# Patient Record
Sex: Female | Born: 1935 | Race: White | Hispanic: No | Marital: Married | Smoking: Never smoker
Health system: Southern US, Community
[De-identification: ages and names within clinical notes are randomized; demographics above are authoritative.]

## PROBLEM LIST (undated history)

## (undated) DIAGNOSIS — I509 Heart failure, unspecified: Secondary | ICD-10-CM

## (undated) DIAGNOSIS — G629 Polyneuropathy, unspecified: Secondary | ICD-10-CM

## (undated) DIAGNOSIS — I2510 Atherosclerotic heart disease of native coronary artery without angina pectoris: Secondary | ICD-10-CM

## (undated) DIAGNOSIS — H8109 Meniere's disease, unspecified ear: Secondary | ICD-10-CM

## (undated) HISTORY — PX: OTHER SURGICAL HISTORY: SHX169

## (undated) HISTORY — PX: LAPAROSCOPIC GASTRIC BANDING: SHX1100

---

## 2018-01-15 ENCOUNTER — Inpatient Hospital Stay
Admission: EM | Admit: 2018-01-15 | Discharge: 2018-01-19 | DRG: 481 | Disposition: A | Discharge status: Skilled Nursing Facility | Payer: Medicare PPO | Attending: Internal Medicine | Admitting: Internal Medicine

## 2018-01-15 ENCOUNTER — Encounter: Payer: Self-pay | Admitting: *Deleted

## 2018-01-15 ENCOUNTER — Other Ambulatory Visit: Payer: Self-pay

## 2018-01-15 ENCOUNTER — Emergency Department: Payer: Medicare PPO

## 2018-01-15 DIAGNOSIS — Z79899 Other long term (current) drug therapy: Secondary | ICD-10-CM

## 2018-01-15 DIAGNOSIS — Z66 Do not resuscitate: Secondary | ICD-10-CM | POA: Diagnosis present

## 2018-01-15 DIAGNOSIS — D62 Acute posthemorrhagic anemia: Secondary | ICD-10-CM | POA: Diagnosis not present

## 2018-01-15 DIAGNOSIS — W1830XA Fall on same level, unspecified, initial encounter: Secondary | ICD-10-CM | POA: Diagnosis present

## 2018-01-15 DIAGNOSIS — N189 Chronic kidney disease, unspecified: Secondary | ICD-10-CM | POA: Diagnosis present

## 2018-01-15 DIAGNOSIS — Z419 Encounter for procedure for purposes other than remedying health state, unspecified: Secondary | ICD-10-CM

## 2018-01-15 DIAGNOSIS — G629 Polyneuropathy, unspecified: Secondary | ICD-10-CM | POA: Diagnosis present

## 2018-01-15 DIAGNOSIS — I10 Essential (primary) hypertension: Secondary | ICD-10-CM | POA: Diagnosis present

## 2018-01-15 DIAGNOSIS — I2510 Atherosclerotic heart disease of native coronary artery without angina pectoris: Secondary | ICD-10-CM | POA: Diagnosis present

## 2018-01-15 DIAGNOSIS — Z6839 Body mass index (BMI) 39.0-39.9, adult: Secondary | ICD-10-CM

## 2018-01-15 DIAGNOSIS — W19XXXA Unspecified fall, initial encounter: Secondary | ICD-10-CM

## 2018-01-15 DIAGNOSIS — E669 Obesity, unspecified: Secondary | ICD-10-CM | POA: Diagnosis present

## 2018-01-15 DIAGNOSIS — S72142A Displaced intertrochanteric fracture of left femur, initial encounter for closed fracture: Principal | ICD-10-CM | POA: Diagnosis present

## 2018-01-15 DIAGNOSIS — I509 Heart failure, unspecified: Secondary | ICD-10-CM | POA: Diagnosis present

## 2018-01-15 DIAGNOSIS — Z955 Presence of coronary angioplasty implant and graft: Secondary | ICD-10-CM | POA: Diagnosis not present

## 2018-01-15 DIAGNOSIS — Z7982 Long term (current) use of aspirin: Secondary | ICD-10-CM

## 2018-01-15 DIAGNOSIS — Z791 Long term (current) use of non-steroidal anti-inflammatories (NSAID): Secondary | ICD-10-CM | POA: Diagnosis not present

## 2018-01-15 DIAGNOSIS — D631 Anemia in chronic kidney disease: Secondary | ICD-10-CM | POA: Diagnosis present

## 2018-01-15 DIAGNOSIS — H8109 Meniere's disease, unspecified ear: Secondary | ICD-10-CM | POA: Diagnosis present

## 2018-01-15 DIAGNOSIS — S72009A Fracture of unspecified part of neck of unspecified femur, initial encounter for closed fracture: Secondary | ICD-10-CM | POA: Diagnosis present

## 2018-01-15 DIAGNOSIS — I130 Hypertensive heart and chronic kidney disease with heart failure and stage 1 through stage 4 chronic kidney disease, or unspecified chronic kidney disease: Secondary | ICD-10-CM | POA: Diagnosis present

## 2018-01-15 HISTORY — DX: Atherosclerotic heart disease of native coronary artery without angina pectoris: I25.10

## 2018-01-15 HISTORY — DX: Meniere's disease, unspecified ear: H81.09

## 2018-01-15 HISTORY — DX: Heart failure, unspecified: I50.9

## 2018-01-15 HISTORY — DX: Polyneuropathy, unspecified: G62.9

## 2018-01-15 LAB — CBC WITH DIFFERENTIAL/PLATELET
Abs Immature Granulocytes: 0.05 10*3/uL (ref 0.00–0.07)
Basophils Absolute: 0.1 10*3/uL (ref 0.0–0.1)
Basophils Relative: 1 %
Eosinophils Absolute: 0.2 10*3/uL (ref 0.0–0.5)
Eosinophils Relative: 2 %
HCT: 34.6 % — ABNORMAL LOW (ref 36.0–46.0)
Hemoglobin: 10.9 g/dL — ABNORMAL LOW (ref 12.0–15.0)
Immature Granulocytes: 1 %
Lymphocytes Relative: 39 %
Lymphs Abs: 3.9 10*3/uL (ref 0.7–4.0)
MCH: 29.2 pg (ref 26.0–34.0)
MCHC: 31.5 g/dL (ref 30.0–36.0)
MCV: 92.8 fL (ref 80.0–100.0)
MONO ABS: 0.7 10*3/uL (ref 0.1–1.0)
Monocytes Relative: 7 %
Neutro Abs: 5.1 10*3/uL (ref 1.7–7.7)
Neutrophils Relative %: 50 %
Platelets: 232 10*3/uL (ref 150–400)
RBC: 3.73 MIL/uL — ABNORMAL LOW (ref 3.87–5.11)
RDW: 13.6 % (ref 11.5–15.5)
WBC: 10 10*3/uL (ref 4.0–10.5)
nRBC: 0 % (ref 0.0–0.2)

## 2018-01-15 LAB — COMPREHENSIVE METABOLIC PANEL
ALT: 19 U/L (ref 0–44)
AST: 28 U/L (ref 15–41)
Albumin: 4 g/dL (ref 3.5–5.0)
Alkaline Phosphatase: 89 U/L (ref 38–126)
Anion gap: 11 (ref 5–15)
BUN: 20 mg/dL (ref 8–23)
CO2: 25 mmol/L (ref 22–32)
Calcium: 8.8 mg/dL — ABNORMAL LOW (ref 8.9–10.3)
Chloride: 102 mmol/L (ref 98–111)
Creatinine, Ser: 1.22 mg/dL — ABNORMAL HIGH (ref 0.44–1.00)
GFR calc Af Amer: 48 mL/min — ABNORMAL LOW (ref >60)
GFR, EST NON AFRICAN AMERICAN: 41 mL/min — AB (ref >60)
Glucose, Bld: 145 mg/dL — ABNORMAL HIGH (ref 70–99)
Potassium: 4.1 mmol/L (ref 3.5–5.1)
Sodium: 138 mmol/L (ref 135–145)
Total Bilirubin: 0.5 mg/dL (ref 0.3–1.2)
Total Protein: 6.5 g/dL (ref 6.5–8.1)

## 2018-01-15 NOTE — ED Notes (Signed)
Report off to henry rn  

## 2018-01-15 NOTE — ED Notes (Signed)
Pt reports falling backwards and hit head.  No loc. No lac.  Pt denies neck or back pain.  Pt has left hip pain with rotation and shortening.  Pt alert.  Speech clear.  Iv in place.

## 2018-01-15 NOTE — ED Provider Notes (Signed)
[facility] Emergency Department Provider Note    First MD Initiated Contact with Patient 01/15/18 2154     (approximate)  I have reviewed the triage vital signs and the nursing notes.   HISTORY  Chief Complaint Fall    HPI Blanchard KelchRuthann Raider is a 85 y.o. female history of sensitive neuropathy not any blood thinners presents the ER after mechanical fall.  States she was getting up without assistance to get clean dishes and fell hitting her left hip.  Did not hit her head.  No LOC.  Denies any chest pain or shortness of breath prior to the episode.  Was unable to stand due to pain.  Patient arrives with EMS with obvious deformity to left leg with shortened and rotated leg.    No past medical history on file. No family history on file.  There are no active problems to display for this patient.     Prior to Admission medications   Not on File    Allergies Patient has no known allergies.    Social History Social History   Tobacco Use  . Smoking status: Never Smoker  . Smokeless tobacco: Never Used  Substance Use Topics  . Alcohol use: Never    Frequency: Never  . Drug use: Never    Review of Systems Patient denies headaches, rhinorrhea, blurry vision, numbness, shortness of breath, chest pain, edema, cough, abdominal pain, nausea, vomiting, diarrhea, dysuria, fevers, rashes or hallucinations unless otherwise stated above in HPI. ____________________________________________   PHYSICAL EXAM:  VITAL SIGNS: Vitals:   01/15/18 2105 01/15/18 2204  BP: (!) 151/76 (!) 150/74  Pulse: 90 84  Resp: 20 15  Temp: 98.4 F (36.9 C)   SpO2: 93% 97%    Constitutional: Alert and oriented.  Eyes: Conjunctivae are normal.  Head: Atraumatic. Nose: No congestion/rhinnorhea. Mouth/Throat: Mucous membranes are moist.   Neck: No stridor. Painless ROM.  Cardiovascular: Normal rate, regular rhythm. Grossly normal heart sounds.  Good peripheral  circulation. Respiratory: Normal respiratory effort.  No retractions. Lungs CTAB. Gastrointestinal: Soft and nontender. No distention. No abdominal bruits. No CVA tenderness. Genitourinary:  Musculoskeletal: pain with left leg log roll, shortened and externally rotated. No lower extremity tenderness nor edema.  No joint effusions. Neurologic:  Normal speech and language. No gross focal neurologic deficits are appreciated. No facial droop Skin:  Skin is warm, dry and intact. No rash noted. Psychiatric: Mood and affect are normal. Speech and behavior are normal.  ____________________________________________   LABS (all labs ordered are listed, but only abnormal results are displayed)  Results for orders placed or performed during the hospital encounter of 01/15/18 (from the past 24 hour(s))  CBC with Differential/Platelet     Status: Abnormal   Collection Time: 01/15/18  9:15 PM  Result Value Ref Range   WBC 10.0 4.0 - 10.5 K/uL   RBC 3.73 (L) 3.87 - 5.11 MIL/uL   Hemoglobin 10.9 (L) 12.0 - 15.0 g/dL   HCT 40.934.6 (L) 81.136.0 - 91.446.0 %   MCV 92.8 80.0 - 100.0 fL   MCH 29.2 26.0 - 34.0 pg   MCHC 31.5 30.0 - 36.0 g/dL   RDW 78.213.6 95.611.5 - 21.315.5 %   Platelets 232 150 - 400 K/uL   nRBC 0.0 0.0 - 0.2 %   Neutrophils Relative % 50 %   Neutro Abs 5.1 1.7 - 7.7 K/uL   Lymphocytes Relative 39 %   Lymphs Abs 3.9 0.7 - 4.0 K/uL   Monocytes Relative  7 %   Monocytes Absolute 0.7 0.1 - 1.0 K/uL   Eosinophils Relative 2 %   Eosinophils Absolute 0.2 0.0 - 0.5 K/uL   Basophils Relative 1 %   Basophils Absolute 0.1 0.0 - 0.1 K/uL   Immature Granulocytes 1 %   Abs Immature Granulocytes 0.05 0.00 - 0.07 K/uL  Comprehensive metabolic panel     Status: Abnormal   Collection Time: 01/15/18  9:15 PM  Result Value Ref Range   Sodium 138 135 - 145 mmol/L   Potassium 4.1 3.5 - 5.1 mmol/L   Chloride 102 98 - 111 mmol/L   CO2 25 22 - 32 mmol/L   Glucose, Bld 145 (H) 70 - 99 mg/dL   BUN 20 8 - 23 mg/dL    Creatinine, Ser 6.28 (H) 0.44 - 1.00 mg/dL   Calcium 8.8 (L) 8.9 - 10.3 mg/dL   Total Protein 6.5 6.5 - 8.1 g/dL   Albumin 4.0 3.5 - 5.0 g/dL   AST 28 15 - 41 U/L   ALT 19 0 - 44 U/L   Alkaline Phosphatase 89 38 - 126 U/L   Total Bilirubin 0.5 0.3 - 1.2 mg/dL   GFR calc non Af Amer 41 (L) >60 mL/min   GFR calc Af Amer 48 (L) >60 mL/min   Anion gap 11 5 - 15   ____________________________________________  EKG My review and personal interpretation at Time: 21:10   Indication: fall  Rate: 90  Rhythm: sinus Axis: normal Other: normal intervals, no stemi ____________________________________________  RADIOLOGY  I personally reviewed all radiographic images ordered to evaluate for the above acute complaints and reviewed radiology reports and findings.  These findings were personally discussed with the patient.  Please see medical record for radiology report.  ____________________________________________   PROCEDURES  Procedure(s) performed:  Procedures    Critical Care performed: no ____________________________________________   INITIAL IMPRESSION / ASSESSMENT AND PLAN / ED COURSE  Pertinent labs & imaging results that were available during my care of the patient were reviewed by me and considered in my medical decision making (see chart for details).   DDX: fracture, contusion, dislocation  Shalin Beadnell is a 85 y.o. who presents to the ED with evidence of left hip fracture.  Blood work is reassuring.  [facility]  Have discussed with the patient and available family all diagnostics and treatments performed thus far and all questions were answered to the best of my ability. The patient demonstrates understanding and agreement with plan.       As part of my medical decision making, I reviewed the following data within the electronic MEDICAL RECORD NUMBER Nursing notes reviewed and incorporated, Labs reviewed, notes from  prior ED visits.   ____________________________________________   FINAL CLINICAL IMPRESSION(S) / ED DIAGNOSES  Final diagnoses:  Closed 2-part intertrochanteric fracture of left femur, initial encounter (HCC)      NEW MEDICATIONS STARTED DURING THIS VISIT:  New Prescriptions   No medications on file     Note:  This document was prepared using Dragon voice recognition software and may include unintentional dictation errors.    Willy Eddy, MD 01/15/18 2256

## 2018-01-15 NOTE — ED Triage Notes (Signed)
Pt brought in via  [facility] from [facility].  Pt fell and has shortening of leg and rotation.  Pt has left hip pain.  Pt alert.  Iv in place

## 2018-01-16 ENCOUNTER — Inpatient Hospital Stay: Payer: Medicare PPO

## 2018-01-16 ENCOUNTER — Other Ambulatory Visit: Payer: Self-pay

## 2018-01-16 ENCOUNTER — Inpatient Hospital Stay: Payer: Medicare PPO | Admitting: Anesthesiology

## 2018-01-16 ENCOUNTER — Encounter: Payer: Self-pay | Admitting: Internal Medicine

## 2018-01-16 ENCOUNTER — Encounter
Admission: EM | Disposition: A | Discharge status: Skilled Nursing Facility | Payer: Self-pay | Source: Home / Self Care | Attending: Internal Medicine

## 2018-01-16 HISTORY — PX: INTRAMEDULLARY (IM) NAIL INTERTROCHANTERIC: SHX5875

## 2018-01-16 LAB — LIPID PANEL
Cholesterol: 196 mg/dL (ref 0–200)
HDL: 39 mg/dL — ABNORMAL LOW (ref >40)
LDL CALC: 102 mg/dL — AB (ref 0–99)
Total CHOL/HDL Ratio: 5 RATIO
Triglycerides: 275 mg/dL — ABNORMAL HIGH (ref <150)
VLDL: 55 mg/dL — ABNORMAL HIGH (ref 0–40)

## 2018-01-16 LAB — CBC
HCT: 32.8 % — ABNORMAL LOW (ref 36.0–46.0)
Hemoglobin: 10.3 g/dL — ABNORMAL LOW (ref 12.0–15.0)
MCH: 28.9 pg (ref 26.0–34.0)
MCHC: 31.4 g/dL (ref 30.0–36.0)
MCV: 91.9 fL (ref 80.0–100.0)
Platelets: 201 10*3/uL (ref 150–400)
RBC: 3.57 MIL/uL — ABNORMAL LOW (ref 3.87–5.11)
RDW: 13.4 % (ref 11.5–15.5)
WBC: 10.9 10*3/uL — ABNORMAL HIGH (ref 4.0–10.5)
nRBC: 0 % (ref 0.0–0.2)

## 2018-01-16 LAB — PROTIME-INR
INR: 0.96
Prothrombin Time: 12.7 seconds (ref 11.4–15.2)

## 2018-01-16 LAB — BASIC METABOLIC PANEL
Anion gap: 9 (ref 5–15)
BUN: 20 mg/dL (ref 8–23)
CO2: 26 mmol/L (ref 22–32)
CREATININE: 1.19 mg/dL — AB (ref 0.44–1.00)
Calcium: 8.6 mg/dL — ABNORMAL LOW (ref 8.9–10.3)
Chloride: 102 mmol/L (ref 98–111)
GFR calc non Af Amer: 42 mL/min — ABNORMAL LOW (ref >60)
GFR, EST AFRICAN AMERICAN: 49 mL/min — AB (ref >60)
Glucose, Bld: 156 mg/dL — ABNORMAL HIGH (ref 70–99)
Potassium: 4.1 mmol/L (ref 3.5–5.1)
Sodium: 137 mmol/L (ref 135–145)

## 2018-01-16 LAB — SURGICAL PCR SCREEN
MRSA, PCR: NEGATIVE
Staphylococcus aureus: NEGATIVE

## 2018-01-16 LAB — SAMPLE TO BLOOD BANK

## 2018-01-16 SURGERY — FIXATION, FRACTURE, INTERTROCHANTERIC, WITH INTRAMEDULLARY ROD
Anesthesia: Spinal | Site: Hip | Laterality: Left

## 2018-01-16 MED ORDER — CEFAZOLIN SODIUM-DEXTROSE 2-4 GM/100ML-% IV SOLN
2.0000 g | INTRAVENOUS | Status: AC
  Administered 2018-01-16: 2 g via INTRAVENOUS
  Filled 2018-01-16: qty 100

## 2018-01-16 MED ORDER — SODIUM CHLORIDE 0.9 % IV SOLN
INTRAVENOUS | Status: DC | PRN
  Administered 2018-01-16: 1000 mL

## 2018-01-16 MED ORDER — PHENYLEPHRINE HCL 10 MG/ML IJ SOLN
INTRAMUSCULAR | Status: DC | PRN
  Administered 2018-01-16 (×2): 100 ug via INTRAVENOUS

## 2018-01-16 MED ORDER — PROPOFOL 500 MG/50ML IV EMUL
INTRAVENOUS | Status: DC | PRN
  Administered 2018-01-16: 25 ug/kg/min via INTRAVENOUS

## 2018-01-16 MED ORDER — KETAMINE HCL 50 MG/ML IJ SOLN
INTRAMUSCULAR | Status: AC
  Filled 2018-01-16: qty 10

## 2018-01-16 MED ORDER — BISACODYL 10 MG RE SUPP: 10.0000 mg | Freq: Every day | RECTAL | Status: DC | PRN

## 2018-01-16 MED ORDER — PANTOPRAZOLE SODIUM 40 MG PO TBEC
40.0000 mg | DELAYED_RELEASE_TABLET | Freq: Two times a day (BID) | ORAL | Status: DC
  Administered 2018-01-16 – 2018-01-19 (×6): 40 mg via ORAL
  Filled 2018-01-16 (×6): qty 1

## 2018-01-16 MED ORDER — MENTHOL 3 MG MT LOZG
1.0000 | LOZENGE | OROMUCOSAL | Status: DC | PRN
  Filled 2018-01-16: qty 9

## 2018-01-16 MED ORDER — ATENOLOL 25 MG PO TABS
25.0000 mg | ORAL_TABLET | Freq: Every day | ORAL | Status: DC
  Administered 2018-01-16 – 2018-01-19 (×4): 25 mg via ORAL
  Filled 2018-01-16 (×4): qty 1

## 2018-01-16 MED ORDER — OXYCODONE HCL 5 MG PO TABS
5.0000 mg | ORAL_TABLET | ORAL | Status: DC | PRN
  Administered 2018-01-17 – 2018-01-19 (×3): 5 mg via ORAL
  Filled 2018-01-16 (×3): qty 1

## 2018-01-16 MED ORDER — ESCITALOPRAM OXALATE 10 MG PO TABS
20.0000 mg | ORAL_TABLET | Freq: Every day | ORAL | Status: DC
  Administered 2018-01-17 – 2018-01-19 (×3): 20 mg via ORAL
  Filled 2018-01-16 (×4): qty 2

## 2018-01-16 MED ORDER — CELECOXIB 200 MG PO CAPS
200.0000 mg | ORAL_CAPSULE | Freq: Two times a day (BID) | ORAL | Status: DC
  Administered 2018-01-16 – 2018-01-19 (×6): 200 mg via ORAL
  Filled 2018-01-16 (×6): qty 1

## 2018-01-16 MED ORDER — DOCUSATE SODIUM 100 MG PO CAPS: 100.0000 mg | ORAL_CAPSULE | Freq: Two times a day (BID) | ORAL | Status: DC | PRN

## 2018-01-16 MED ORDER — OXYCODONE HCL 5 MG PO TABS
5.0000 mg | ORAL_TABLET | ORAL | Status: DC | PRN
  Administered 2018-01-16: 5 mg via ORAL
  Administered 2018-01-16: 10 mg via ORAL
  Filled 2018-01-16: qty 2
  Filled 2018-01-16 (×2): qty 1

## 2018-01-16 MED ORDER — CEFAZOLIN SODIUM-DEXTROSE 2-4 GM/100ML-% IV SOLN
2.0000 g | Freq: Four times a day (QID) | INTRAVENOUS | Status: AC
  Administered 2018-01-16 – 2018-01-17 (×4): 2 g via INTRAVENOUS
  Filled 2018-01-16 (×5): qty 100

## 2018-01-16 MED ORDER — GABAPENTIN 300 MG PO CAPS
300.0000 mg | ORAL_CAPSULE | Freq: Three times a day (TID) | ORAL | Status: DC
  Administered 2018-01-17 – 2018-01-19 (×5): 300 mg via ORAL
  Filled 2018-01-16 (×8): qty 1

## 2018-01-16 MED ORDER — ONDANSETRON HCL 4 MG/2ML IJ SOLN: 4.0000 mg | Freq: Four times a day (QID) | INTRAMUSCULAR | Status: DC | PRN

## 2018-01-16 MED ORDER — METOCLOPRAMIDE HCL 5 MG/ML IJ SOLN: 5.0000 mg | Freq: Three times a day (TID) | INTRAMUSCULAR | Status: DC | PRN

## 2018-01-16 MED ORDER — ACETAMINOPHEN 10 MG/ML IV SOLN
1000.0000 mg | Freq: Four times a day (QID) | INTRAVENOUS | Status: AC
  Administered 2018-01-17 (×4): 1000 mg via INTRAVENOUS
  Filled 2018-01-16 (×4): qty 100

## 2018-01-16 MED ORDER — FERROUS SULFATE 325 (65 FE) MG PO TABS
325.0000 mg | ORAL_TABLET | Freq: Two times a day (BID) | ORAL | Status: DC
  Administered 2018-01-17 – 2018-01-19 (×5): 325 mg via ORAL
  Filled 2018-01-16 (×5): qty 1

## 2018-01-16 MED ORDER — ACETAMINOPHEN 10 MG/ML IV SOLN
INTRAVENOUS | Status: AC
  Filled 2018-01-16: qty 100

## 2018-01-16 MED ORDER — PHENOL 1.4 % MT LIQD
1.0000 | OROMUCOSAL | Status: DC | PRN
  Filled 2018-01-16: qty 177

## 2018-01-16 MED ORDER — MECLIZINE HCL 25 MG PO TABS
25.0000 mg | ORAL_TABLET | Freq: Two times a day (BID) | ORAL | Status: DC
  Administered 2018-01-17 – 2018-01-19 (×5): 25 mg via ORAL
  Filled 2018-01-16 (×8): qty 1

## 2018-01-16 MED ORDER — NORTRIPTYLINE HCL 10 MG PO CAPS
40.0000 mg | ORAL_CAPSULE | Freq: Every day | ORAL | Status: DC
  Administered 2018-01-17 – 2018-01-18 (×2): 40 mg via ORAL
  Filled 2018-01-16 (×4): qty 4

## 2018-01-16 MED ORDER — SODIUM CHLORIDE 0.9 % IV SOLN
INTRAVENOUS | Status: DC
  Administered 2018-01-16: 23:00:00 via INTRAVENOUS

## 2018-01-16 MED ORDER — PANTOPRAZOLE SODIUM 40 MG PO TBEC
40.0000 mg | DELAYED_RELEASE_TABLET | Freq: Every day | ORAL | Status: DC
  Administered 2018-01-16: 40 mg via ORAL
  Filled 2018-01-16: qty 1

## 2018-01-16 MED ORDER — GABAPENTIN 300 MG PO CAPS
300.0000 mg | ORAL_CAPSULE | Freq: Every day | ORAL | Status: DC
  Administered 2018-01-16 – 2018-01-18 (×3): 300 mg via ORAL
  Filled 2018-01-16: qty 1

## 2018-01-16 MED ORDER — ENOXAPARIN SODIUM 40 MG/0.4ML ~~LOC~~ SOLN
40.0000 mg | SUBCUTANEOUS | Status: DC
  Filled 2018-01-16 (×2): qty 0.4

## 2018-01-16 MED ORDER — HYDROMORPHONE HCL 1 MG/ML IJ SOLN: 0.5000 mg | INTRAMUSCULAR | Status: DC | PRN

## 2018-01-16 MED ORDER — TRAMADOL HCL 50 MG PO TABS: 50.0000 mg | ORAL_TABLET | ORAL | Status: DC | PRN

## 2018-01-16 MED ORDER — FENTANYL CITRATE (PF) 100 MCG/2ML IJ SOLN
INTRAMUSCULAR | Status: DC | PRN
  Administered 2018-01-16 (×2): 50 ug via INTRAVENOUS

## 2018-01-16 MED ORDER — ZOLPIDEM TARTRATE 5 MG PO TABS: 5.0000 mg | ORAL_TABLET | Freq: Every evening | ORAL | Status: DC | PRN

## 2018-01-16 MED ORDER — SODIUM CHLORIDE 0.9 % IV SOLN
INTRAVENOUS | Status: DC | PRN
  Administered 2018-01-16: 30 ug/min via INTRAVENOUS

## 2018-01-16 MED ORDER — SODIUM CHLORIDE 0.9 % IV SOLN
INTRAVENOUS | Status: DC | PRN
  Administered 2018-01-16: 19:00:00 via INTRAVENOUS

## 2018-01-16 MED ORDER — ACETAMINOPHEN 10 MG/ML IV SOLN
INTRAVENOUS | Status: DC | PRN
  Administered 2018-01-16: 1000 mg via INTRAVENOUS

## 2018-01-16 MED ORDER — BUPIVACAINE HCL (PF) 0.5 % IJ SOLN
INTRAMUSCULAR | Status: AC
  Filled 2018-01-16: qty 10

## 2018-01-16 MED ORDER — ONDANSETRON HCL 4 MG PO TABS: 4.0000 mg | ORAL_TABLET | Freq: Four times a day (QID) | ORAL | Status: DC | PRN

## 2018-01-16 MED ORDER — GABAPENTIN 300 MG PO CAPS: 600.0000 mg | ORAL_CAPSULE | Freq: Three times a day (TID) | ORAL | Status: DC

## 2018-01-16 MED ORDER — PROPOFOL 10 MG/ML IV BOLUS
INTRAVENOUS | Status: DC | PRN
  Administered 2018-01-16: 20 mg via INTRAVENOUS

## 2018-01-16 MED ORDER — SENNOSIDES-DOCUSATE SODIUM 8.6-50 MG PO TABS
1.0000 | ORAL_TABLET | Freq: Two times a day (BID) | ORAL | Status: DC
  Administered 2018-01-16 – 2018-01-19 (×6): 1 via ORAL
  Filled 2018-01-16 (×6): qty 1

## 2018-01-16 MED ORDER — ACETAMINOPHEN 325 MG PO TABS: 325.0000 mg | ORAL_TABLET | Freq: Four times a day (QID) | ORAL | Status: DC | PRN

## 2018-01-16 MED ORDER — KETAMINE HCL 50 MG/ML IJ SOLN
INTRAMUSCULAR | Status: DC | PRN
  Administered 2018-01-16: 25 mg via INTRAVENOUS

## 2018-01-16 MED ORDER — BUPIVACAINE HCL (PF) 0.5 % IJ SOLN
INTRAMUSCULAR | Status: DC | PRN
  Administered 2018-01-16: 3 mL via INTRATHECAL

## 2018-01-16 MED ORDER — LOSARTAN POTASSIUM 50 MG PO TABS
50.0000 mg | ORAL_TABLET | Freq: Every day | ORAL | Status: DC
  Administered 2018-01-18 – 2018-01-19 (×2): 50 mg via ORAL
  Filled 2018-01-16 (×2): qty 1

## 2018-01-16 MED ORDER — METOCLOPRAMIDE HCL 10 MG PO TABS
10.0000 mg | ORAL_TABLET | Freq: Three times a day (TID) | ORAL | Status: AC
  Administered 2018-01-17 – 2018-01-18 (×7): 10 mg via ORAL
  Filled 2018-01-16 (×7): qty 1

## 2018-01-16 MED ORDER — FENTANYL CITRATE (PF) 100 MCG/2ML IJ SOLN
INTRAMUSCULAR | Status: AC
  Filled 2018-01-16: qty 2

## 2018-01-16 MED ORDER — MAGNESIUM HYDROXIDE 400 MG/5ML PO SUSP
30.0000 mL | Freq: Every day | ORAL | Status: DC | PRN
  Administered 2018-01-17: 30 mL via ORAL
  Filled 2018-01-16: qty 30

## 2018-01-16 MED ORDER — OXYCODONE HCL 5 MG PO TABS
10.0000 mg | ORAL_TABLET | ORAL | Status: DC | PRN
  Administered 2018-01-17 – 2018-01-19 (×3): 10 mg via ORAL
  Filled 2018-01-16 (×3): qty 2

## 2018-01-16 MED ORDER — METOCLOPRAMIDE HCL 10 MG PO TABS: 5.0000 mg | ORAL_TABLET | Freq: Three times a day (TID) | ORAL | Status: DC | PRN

## 2018-01-16 MED ORDER — FLEET ENEMA 7-19 GM/118ML RE ENEM: 1.0000 | ENEMA | Freq: Once | RECTAL | Status: DC | PRN

## 2018-01-16 SURGICAL SUPPLY — 40 items
BIT DRILL SHORT 4.2 (BIT) IMPLANT
BLADE TFNA HELICAL 105 STRL (Anchor) ×2 IMPLANT
CANISTER SUCT 1200ML W/VALVE (MISCELLANEOUS) ×3 IMPLANT
COVER WAND RF STERILE (DRAPES) ×1 IMPLANT
DRAPE C-ARMOR (DRAPES) ×1 IMPLANT
DRAPE INCISE IOBAN 66X45 STRL (DRAPES) ×2 IMPLANT
DRAPE SHEET LG 3/4 BI-LAMINATE (DRAPES) ×3 IMPLANT
DRILL BIT SHORT 4.2 (BIT) ×2
DRSG DERMACEA 8X12 NADH (GAUZE/BANDAGES/DRESSINGS) ×3 IMPLANT
DRSG OPSITE POSTOP 3X4 (GAUZE/BANDAGES/DRESSINGS) ×5 IMPLANT
DRSG OPSITE POSTOP 4X12 (GAUZE/BANDAGES/DRESSINGS) ×1 IMPLANT
DRSG OPSITE POSTOP 4X6 (GAUZE/BANDAGES/DRESSINGS) ×2 IMPLANT
DURAPREP 26ML APPLICATOR (WOUND CARE) ×3 IMPLANT
ELECT REM PT RETURN 9FT ADLT (ELECTROSURGICAL) ×3
ELECTRODE REM PT RTRN 9FT ADLT (ELECTROSURGICAL) ×1 IMPLANT
GAUZE SPONGE 4X4 12PLY STRL (GAUZE/BANDAGES/DRESSINGS) ×1 IMPLANT
GLOVE BIO SURGEON STRL SZ7 (GLOVE) ×4 IMPLANT
GLOVE BIOGEL M STRL SZ7.5 (GLOVE) ×5 IMPLANT
GLOVE INDICATOR 8.0 STRL GRN (GLOVE) ×7 IMPLANT
GOWN STRL REUS W/ TWL LRG LVL3 (GOWN DISPOSABLE) ×2 IMPLANT
GOWN STRL REUS W/TWL LRG LVL3 (GOWN DISPOSABLE) ×6
GUIDEWIRE 3.2X400 (WIRE) ×4 IMPLANT
KIT TURNOVER CYSTO (KITS) ×3 IMPLANT
MAT ABSORB  FLUID 56X50 GRAY (MISCELLANEOUS)
MAT ABSORB FLUID 56X50 GRAY (MISCELLANEOUS) ×1 IMPLANT
NAIL CANN TFNA 11X130X380 LEFT (Nail) ×2 IMPLANT
NS IRRIG 1000ML POUR BTL (IV SOLUTION) ×3 IMPLANT
PACK HIP COMPR (MISCELLANEOUS) ×3 IMPLANT
REAMER ROD DEEP FLUTE 2.5X950 (INSTRUMENTS) ×3 IMPLANT
SCREW CANN LOCK TI FT 5X42 (Screw) ×2 IMPLANT
SOL PREP PVP 2OZ (MISCELLANEOUS) ×3
SOLUTION PREP PVP 2OZ (MISCELLANEOUS) ×1 IMPLANT
STAPLER SKIN PROX 35W (STAPLE) ×3 IMPLANT
SUCTION FRAZIER HANDLE 10FR (MISCELLANEOUS) ×2
SUCTION TUBE FRAZIER 10FR DISP (MISCELLANEOUS) ×1 IMPLANT
SUT VIC AB 0 CT1 36 (SUTURE) ×3 IMPLANT
SUT VIC AB 1 CT1 36 (SUTURE) ×3 IMPLANT
SUT VIC AB 2-0 CT1 27 (SUTURE) ×2
SUT VIC AB 2-0 CT1 TAPERPNT 27 (SUTURE) ×1 IMPLANT
TAPE MICROFOAM 4IN (TAPE) ×3 IMPLANT

## 2018-01-16 NOTE — Clinical Social Work Placement (Signed)
   CLINICAL SOCIAL WORK PLACEMENT  NOTE  Date:  01/16/2018  Patient Details  Name: Kelly Mcgrath MRN: [id number] Date of Birth: May 09, 1933  Clinical Social Work is seeking post-discharge placement for this patient at the Skilled  Nursing Facility level of care (*CSW will initial, date and re-position this form in  chart as items are completed):  Yes   Patient/family provided with [facility] Clinical Social Work Department's list of facilities offering this level of care within the geographic area requested by the patient (or if unable, by the patient's family).  Yes   Patient/family informed of their freedom to choose among providers that offer the needed level of care, that participate in Medicare, Medicaid or managed care program needed by the patient, have an available bed and are willing to accept the patient.  Yes   Patient/family informed of [name]'s ownership interest in [name] and [name], as well as of the fact that they are under no obligation to receive care at these facilities.  PASRR submitted to EDS on 01/16/18     PASRR number received on 01/16/18     Existing PASRR number confirmed on       FL2 transmitted to all facilities in geographic area requested by pt/family on 01/16/18     FL2 transmitted to all facilities within larger geographic area on       Patient informed that his/her managed care company has contracts with or will negotiate with certain facilities, including the following:            Patient/family informed of bed offers received.  Patient chooses bed at       Physician recommends and patient chooses bed at      Patient to be transferred to   on  .  Patient to be transferred to facility by       Patient family notified on   of transfer.  Name of family member notified:        PHYSICIAN       Additional Comment:    _______________________________________________ Mell Mellott, Darleen CrockerBailey M, LCSW 01/16/2018, 2:25 PM

## 2018-01-16 NOTE — H&P (Addendum)
[facility] [place] [facility]   PATIENT NAME: Kelly KelchRuthann Mcgrath    MR#:  [id number]  DATE OF BIRTH:  27-Dec-1933  DATE OF ADMISSION:  01/15/2018  PRIMARY CARE PHYSICIAN: Patient, No Pcp Per   REQUESTING/REFERRING PHYSICIAN: Roxan Hockeyobinson  CHIEF COMPLAINT:   Chief Complaint  Patient presents with  . Fall    HISTORY OF PRESENT ILLNESS: Kelly Mcgrath  is a 85 y.o. female with a known history of CHF, coronary artery disease, neuropathy, Mnire's disease-[place].  She is supposed to walk with a walker due to her significant neuropathy and also have significant Mnire's disease chronically for last 10 years. At her facility while walking she suddenly lost her balance due to significant vertigo and fell on the floor with pain in the left hip.  Noted to have fracture on left hip on x-ray in ER.  PAST MEDICAL HISTORY:   Past Medical History:  Diagnosis Date  . CHF (congestive heart failure) (HCC)   . Coronary artery disease   . Neuropathy   . Vestibular active Meniere's disease     PAST SURGICAL HISTORY:  Past Surgical History:  Procedure Laterality Date  . BACK SURGERY      SOCIAL HISTORY:  Social History   Tobacco Use  . Smoking status: Never Smoker  . Smokeless tobacco: Never Used  Substance Use Topics  . Alcohol use: Never    Frequency: Never    FAMILY HISTORY:  Family History  Problem Relation Age of Onset  . Hypertension Father     DRUG ALLERGIES: No Known Allergies  REVIEW OF SYSTEMS:   CONSTITUTIONAL: No fever, fatigue or weakness.  EYES: No blurred or double vision.  EARS, NOSE, AND THROAT: No tinnitus or ear pain.  RESPIRATORY: No cough, shortness of breath, wheezing or hemoptysis.  CARDIOVASCULAR: No chest pain, orthopnea, edema.  GASTROINTESTINAL: No nausea, vomiting, diarrhea or abdominal pain.  GENITOURINARY: No dysuria, hematuria.  ENDOCRINE: No polyuria, nocturia,  HEMATOLOGY: No  anemia, easy bruising or bleeding SKIN: No rash or lesion. MUSCULOSKELETAL: Left hip joint pain, no arthritis.   NEUROLOGIC: No tingling, numbness, weakness.  PSYCHIATRY: No anxiety or depression.   MEDICATIONS AT HOME:  Prior to Admission medications   Medication Sig Start Date End Date Taking? Authorizing Provider  atenolol (TENORMIN) 25 MG tablet Take 25 mg by mouth daily.   Yes [provider]  escitalopram (LEXAPRO) 20 MG tablet Take 20 mg by mouth daily. 12/15/17  Yes [provider]  gabapentin (NEURONTIN) 600 MG tablet Take 600-900 mg by mouth 3 (three) times daily. Patient takes 900mg  at bedtime 12/30/17  Yes [provider]  hydrochlorothiazide (HYDRODIURIL) 25 MG tablet Take 25 mg by mouth daily. 12/15/17  Yes [provider]  losartan (COZAAR) 50 MG tablet Take 50 mg by mouth daily. 12/15/17  Yes [provider]  meclizine (ANTIVERT) 25 MG tablet Take 25 mg by mouth 2 (two) times daily. 01/01/18  Yes [provider]  naproxen (NAPROSYN) 500 MG tablet Take 500 mg by mouth 2 (two) times daily. 12/15/17  Yes [provider]  nortriptyline (PAMELOR) 10 MG capsule Take 40 mg by mouth at bedtime. 12/30/17  Yes [provider]  omeprazole (PRILOSEC) 20 MG capsule Take 20 mg by mouth 2 (two) times daily. 12/15/17  Yes [provider]  traZODone (DESYREL) 100 MG tablet Take 100 mg by mouth at bedtime. 12/15/17  Yes [provider]  zolpidem (AMBIEN) 10  MG tablet Take 5-10 mg by mouth at bedtime as needed for sleep. 01/15/18  Yes [provider]      PHYSICAL EXAMINATION:   VITAL SIGNS: Blood pressure (!) 141/65, pulse 83, temperature 98.4 F (36.9 C), temperature source Oral, resp. rate 20, height 5\' 2"  (1.575 m), weight 98.4 kg, SpO2 97 %.  GENERAL:  85 y.o.-year-old patient lying in the bed with no acute distress.  EYES: Pupils equal, round, reactive to light and accommodation. No scleral  icterus. Extraocular muscles intact.  HEENT: Head atraumatic, normocephalic. Oropharynx and nasopharynx clear.  NECK:  Supple, no jugular venous distention. No thyroid enlargement, no tenderness.  LUNGS: Normal breath sounds bilaterally, no wheezing, rales,rhonchi or crepitation. No use of accessory muscles of respiration.  CARDIOVASCULAR: S1, S2 normal. No murmurs, rubs, or gallops.  ABDOMEN: Soft, nontender, nondistended. Bowel sounds present. No organomegaly or mass.  EXTREMITIES: No pedal edema, cyanosis, or clubbing.  NEUROLOGIC: Cranial nerves II through XII are intact. Muscle strength 4/5 in all extremities-except left lower extremity which she is not moving much due to pain. Sensation intact. Gait not checked.  PSYCHIATRIC: The patient is alert and oriented x 3.  SKIN: No obvious rash, lesion, or ulcer.   LABORATORY PANEL:   CBC Recent Labs  Lab 01/15/18 2115  WBC 10.0  HGB 10.9*  HCT 34.6*  PLT 232  MCV 92.8  MCH 29.2  MCHC 31.5  RDW 13.6  LYMPHSABS 3.9  MONOABS 0.7  EOSABS 0.2  BASOSABS 0.1   ------------------------------------------------------------------------------------------------------------------  Chemistries  Recent Labs  Lab 01/15/18 2115  NA 138  K 4.1  CL 102  CO2 25  GLUCOSE 145*  BUN 20  CREATININE 1.22*  CALCIUM 8.8*  AST 28  ALT 19  ALKPHOS 89  BILITOT 0.5   ------------------------------------------------------------------------------------------------------------------ estimated creatinine clearance is 39 mL/min (A) (by C-G formula based on SCr of 1.22 mg/dL (H)). ------------------------------------------------------------------------------------------------------------------ No results for input(s): TSH, T4TOTAL, T3FREE, THYROIDAB in the last 72 hours.  Invalid input(s): FREET3   Coagulation profile No results for input(s): INR, PROTIME in the last 168  hours. ------------------------------------------------------------------------------------------------------------------- No results for input(s): DDIMER in the last 72 hours. -------------------------------------------------------------------------------------------------------------------  Cardiac Enzymes No results for input(s): CKMB, TROPONINI, MYOGLOBIN in the last 168 hours.  Invalid input(s): CK ------------------------------------------------------------------------------------------------------------------ Invalid input(s): POCBNP  ---------------------------------------------------------------------------------------------------------------  Urinalysis No results found for: COLORURINE, APPEARANCEUR, LABSPEC, PHURINE, GLUCOSEU, HGBUR, BILIRUBINUR, KETONESUR, PROTEINUR, UROBILINOGEN, NITRITE, LEUKOCYTESUR   RADIOLOGY: Dg Chest 1 View  Result Date: 01/15/2018 CLINICAL DATA:  Left hip fracture after a fall. EXAM: CHEST  1 VIEW COMPARISON:  None. FINDINGS: Normal heart size and pulmonary vascularity. No focal airspace disease or consolidation in the lungs. No blunting of costophrenic angles. No pneumothorax. Mediastinal contours appear intact. Calcified granuloma in the left upper lung. Degenerative changes in the spine and shoulders. IMPRESSION: No active disease. Electronically Signed   By: Burman NievesWilliam  Stevens M.D.   On: 01/15/2018 22:12   Dg Hip Unilat With Pelvis 2-3 Views Left  Result Date: 01/15/2018 CLINICAL DATA:  Left hip pain and shortening of the left leg after a fall. EXAM: DG HIP (WITH OR WITHOUT PELVIS) 2-3V LEFT COMPARISON:  None. FINDINGS: Comminuted inter trochanteric fractures of the left proximal femur with varus angulation. Mildly displaced greater and lesser trochanteric fragments. Mild impaction of fracture fragments. No dislocation of the hip joint. Pelvis appears intact. SI joints and symphysis pubis are not displaced. Degenerative changes in the right hip.  Postoperative changes in the lower lumbar spine. IMPRESSION:  Acute comminuted intertrochanteric fractures of the left hip with varus angulation. Electronically Signed   By: Burman Nieves M.D.   On: 01/15/2018 22:11    EKG: Orders placed or performed during the hospital encounter of 01/15/18  . EKG 12-Lead  . EKG 12-Lead    IMPRESSION AND PLAN:  *Left hip fracture Admit to medical services as per hospital policies. Called orthopedic consult by ER physician. As per patient, she had coronary artery disease and stent placement within last 1 year and she was also diagnosed with congestive heart failure but it was mild. She just had a physical checkup done by her primary care physician 2 weeks ago before moving into [place]. She denies any recurrent chest pain, shortness of breath or swelling on the legs.  And feels at her baseline. She is at moderate to high risk because of underlying history of coronary artery disease and heart failure but currently in optimal condition and advised to proceed with surgery and be careful with IV fluid use perioperatively and postoperatively to avoid fluid overload. I have also encouraged patient to call her husband to bring her old medical records and medications from home tomorrow morning for review.  *Coronary artery disease and recent stent placement In her home medications here she is not on aspirin or Plavix. I would advised to review her home medications once her husband brings it tomorrow and her records. Currently she might go for surgery tomorrow so we would like to hold aspirin and Plavix anyways until cleared by surgery. Once confirmed- Start ASAP on ASA and Plavix. Cont atenolol, losartan.  Check lipid panel as patient does not seem to be on statin.  *Hypertension Continue home medications and monitor.  *Menier's disease Continue meclizine.  *Acute renal insufficiency Creatinine 1.22, we do not have previous result to  compare. Monitor in hospital.  All the records are reviewed and case discussed with ED provider. Management plans discussed with the patient, family and they are in agreement.  CODE STATUS: DNR    Code Status Orders  (From admission, onward)         Start     Ordered   01/16/18 0037  Do not attempt resuscitation (DNR)  Continuous    Question Answer Comment  In the event of cardiac or respiratory ARREST Do not call a "code blue"   In the event of cardiac or respiratory ARREST Do not perform Intubation, CPR, defibrillation or ACLS   In the event of cardiac or respiratory ARREST Use medication by any route, position, wound care, and other measures to relive pain and suffering. May use oxygen, suction and manual treatment of airway obstruction as needed for comfort.      01/16/18 0036        Code Status History    This patient has a current code status but no historical code status.    Advance Directive Documentation     Most Recent Value  Type of Advance Directive  Living will  Pre-existing out of facility DNR order (yellow form or pink MOST form)  -  "MOST" Form in Place?  -       TOTAL TIME TAKING CARE OF THIS PATIENT: 45 minutes.    Altamese Dilling M.D on 01/16/2018   Between 7am to 6pm - Pager - [phone]  After 6pm go to [email or web address] - password [email or web address]  [facility]  Office  [phone]  CC: Primary care physician; Patient, No Pcp Per   Note: This dictation  was prepared with Dragon dictation along with smaller phrase technology. Any transcriptional errors that result from this process are unintentional.

## 2018-01-16 NOTE — Consult Note (Signed)
ORTHOPAEDIC CONSULTATION  PATIENT NAME: Kelly Mcgrath DOB: 18-Dec-1933  MRN: [id number]  REQUESTING PHYSICIAN: Jama Flavors, MD  Chief Complaint: Left hip pain  HPI: Kelly Mcgrath is a 85 y.o. female who complains of severe left hip pain.  The patient has a history of severe peripheral neuropathy and Mnire's disease.  She arose from a chair yesterday and fell, landing on her left side.  She had the immediate onset of severe left hip pain and was unable to stand or bear weight due to the pain.  Prior to the fall she would typically ambulate with a walker.  She denied any loss of consciousness.  She denied any other trauma.  Past Medical History:  Diagnosis Date  . CHF (congestive heart failure) (HCC)   . Coronary artery disease   . Neuropathy   . Vestibular active Meniere's disease    Past Surgical History:  Procedure Laterality Date  . Cardiac catheterization with stent placement    . LAPAROSCOPIC GASTRIC BANDING     Subsequent reversal  . Lumbar surgery x4     Social History   Socioeconomic History  . Marital status: Married    Spouse name: Not on file  . Number of children: Not on file  . Years of education: Not on file  . Highest education level: Not on file  Occupational History  . Not on file  Social Needs  . Financial resource strain: Not on file  . Food insecurity:    Worry: Not on file    Inability: Not on file  . Transportation needs:    Medical: Not on file    Non-medical: Not on file  Tobacco Use  . Smoking status: Never Smoker  . Smokeless tobacco: Never Used  Substance and Sexual Activity  . Alcohol use: Never    Frequency: Never  . Drug use: Never  . Sexual activity: Not on file  Lifestyle  . Physical activity:    Days per week: Not on file    Minutes per session: Not on file  . Stress: Not on file  Relationships  . Social connections:    Talks on phone: Not on file    Gets together: Not on file    Attends religious service: Not on file   Active member of club or organization: Not on file    Attends meetings of clubs or organizations: Not on file    Relationship status: Not on file  Other Topics Concern  . Not on file  Social History Narrative  . Not on file   Family History  Problem Relation Age of Onset  . Hypertension Father    No Known Allergies Prior to Admission medications   Medication Sig Start Date End Date Taking? Authorizing Provider  atenolol (TENORMIN) 25 MG tablet Take 25 mg by mouth daily.   Yes [provider]  escitalopram (LEXAPRO) 20 MG tablet Take 20 mg by mouth daily. 12/15/17  Yes [provider]  gabapentin (NEURONTIN) 600 MG tablet Take 600-900 mg by mouth 3 (three) times daily. Patient takes 900mg  at bedtime 12/30/17  Yes [provider]  hydrochlorothiazide (HYDRODIURIL) 25 MG tablet Take 25 mg by mouth daily. 12/15/17  Yes [provider]  losartan (COZAAR) 50 MG tablet Take 50 mg by mouth daily. 12/15/17  Yes [provider]  meclizine (ANTIVERT) 25 MG tablet Take 25 mg by mouth 2 (two) times daily. 01/01/18  Yes [provider]  naproxen (NAPROSYN) 500 MG tablet Take 500 mg by  mouth 2 (two) times daily. 12/15/17  Yes [provider]  nortriptyline (PAMELOR) 10 MG capsule Take 40 mg by mouth at bedtime. 12/30/17  Yes [provider]  omeprazole (PRILOSEC) 20 MG capsule Take 20 mg by mouth 2 (two) times daily. 12/15/17  Yes [provider]  traZODone (DESYREL) 100 MG tablet Take 100 mg by mouth at bedtime. 12/15/17  Yes [provider]  zolpidem (AMBIEN) 10 MG tablet Take 5-10 mg by mouth at bedtime as needed for sleep. 01/15/18  Yes [provider]   Dg Chest 1 View  Result Date: 01/15/2018 CLINICAL DATA:  Left hip fracture after a fall. EXAM: CHEST  1 VIEW COMPARISON:  None. FINDINGS: Normal heart size and pulmonary vascularity. No focal airspace disease or consolidation in the lungs. No blunting of  costophrenic angles. No pneumothorax. Mediastinal contours appear intact. Calcified granuloma in the left upper lung. Degenerative changes in the spine and shoulders. IMPRESSION: No active disease. Electronically Signed   By: Burman NievesWilliam  Stevens M.D.   On: 01/15/2018 22:12   Dg Hip Unilat With Pelvis 2-3 Views Left  Result Date: 01/15/2018 CLINICAL DATA:  Left hip pain and shortening of the left leg after a fall. EXAM: DG HIP (WITH OR WITHOUT PELVIS) 2-3V LEFT COMPARISON:  None. FINDINGS: Comminuted inter trochanteric fractures of the left proximal femur with varus angulation. Mildly displaced greater and lesser trochanteric fragments. Mild impaction of fracture fragments. No dislocation of the hip joint. Pelvis appears intact. SI joints and symphysis pubis are not displaced. Degenerative changes in the right hip. Postoperative changes in the lower lumbar spine. IMPRESSION: Acute comminuted intertrochanteric fractures of the left hip with varus angulation. Electronically Signed   By: Burman NievesWilliam  Stevens M.D.   On: 01/15/2018 22:11    Positive ROS: All other systems have been reviewed and were otherwise negative with the exception of those mentioned in the HPI and as above.  Physical Exam: General: Well developed, well nourished obese female seen in no acute distress. HEENT: Atraumatic and normocephalic. Sclera are clear. Extraocular motion is intact. Oropharynx is clear with moist mucosa. Neck: Supple, nontender, good range of motion. No JVD or carotid bruits. Lungs: Clear to auscultation bilaterally. Cardiovascular: Regular rate and rhythm with normal S1 and S2.  2/6 murmur. No gallops or rubs. Pedal pulses are palpable bilaterally. Homans test is negative bilaterally. No significant pretibial or ankle edema. Abdomen: Soft, nontender, and nondistended.  Well-healed surgical scars.  Bowel sounds are present. Skin: No lesions in the area of chief complaint Neurologic: Awake, alert, and oriented.  The  patient demonstrates extremely poor sensation to the lower extremities consistent with her history of neuropathy.  Sensory changes are noted up to the level of the knees.. Motor strength is felt to be 5 over 5 the exception of the left lower extremity was not assessed secondary to the injury.. No clonus or tremor. Good motor coordination. Lymphatic: No axillary or cervical lymphadenopathy  MUSCULOSKELETAL: Semination of the left lower extremity shows reasonably good clinical alignment with Buck's traction in place.  No gross ecchymosis or erythema about the hip.  There is tenderness to palpation along the area of the hip.  Pain is also elicited with attempts at any range of motion of the hip.  Assessment: Comminuted left intertrochanteric femur fracture  Plan: The findings were discussed in detail with the patient.  Recommendations were made for open reduction and internal fixation.  The usual perioperative course was discussed. The risks and benefits of surgical  intervention were reviewed. The patient expressed understanding of the risks and benefits and agreed with plans for surgical intervention.   The surgical site was signed as per the "right site surgery" protocol.   Vernessa Likes P. Angie FavaHooten, Jr. M.D.

## 2018-01-16 NOTE — Anesthesia Procedure Notes (Signed)
Spinal  Patient location during procedure: OR Staffing Anesthesiologist: Kephart, William K, MD Resident/CRNA: Logan, Benjamin, CRNA Performed: resident/CRNA  Preanesthetic Checklist Completed: patient identified, site marked, surgical consent, pre-op evaluation, timeout performed, IV checked, risks and benefits discussed and monitors and equipment checked Spinal Block Patient position: sitting Prep: ChloraPrep and site prepped and draped Patient monitoring: heart rate, continuous pulse ox, blood pressure and cardiac monitor Approach: midline Location: L4-5 Injection technique: single-shot Needle Needle type: Whitacre and Introducer  Needle gauge: 24 G Needle length: 12.7 cm Additional Notes Negative paresthesia. Negative blood return. Positive free-flowing CSF. Expiration date of kit checked and confirmed. Patient tolerated procedure well, without complications.       

## 2018-01-16 NOTE — Consult Note (Signed)
[facility] Cardiology Consultation Note  Patient ID: Kelly Mcgrath, MRN: [id number], DOB/AGE: [birth year] 85 y.o. Admit date: 01/15/2018   Date of Consult: 01/16/2018 Primary Physician: Patient, No Pcp Per Primary Cardiologist: None  Chief Complaint:  Chief Complaint  Patient presents with  . Fall   Reason for Consult: Coronary artery disease  HPI: 85 y.o. female with known coronary artery atherosclerosis status post previous PCI and stent placement of unknown artery many years ago.  The patient does have hypertension hyperlipidemia treated with appropriate medication management.  She has not had any recent episodes of significant congestive heart failure or angina in the last several weeks.  The patient has had some peripheral neuropathy for which she can have feel her lower legs and therefore has some instability.  She had an episode of a fall and broke her hip but no evidence of syncope dizziness nausea or diaphoresis.  The patient does have an EKG showing normal sinus rhythm with left axis deviation and no other changes.  She does have chronic kidney disease and anemia which may have contributed to above but currently is stable.  Therefore the patient is at lowest risk possible for cardiovascular complication with surgical intervention with hip surgery  Past Medical History:  Diagnosis Date  . CHF (congestive heart failure) (HCC)   . Coronary artery disease   . Neuropathy   . Vestibular active Meniere's disease       Surgical History:  Past Surgical History:  Procedure Laterality Date  . Cardiac catheterization with stent placement    . LAPAROSCOPIC GASTRIC BANDING     Subsequent reversal  . Lumbar surgery x4       Home Meds: Prior to Admission medications   Medication Sig Start Date End Date Taking? Authorizing Provider  atenolol (TENORMIN) 25 MG tablet Take 25 mg by mouth daily.   Yes [provider]  escitalopram (LEXAPRO) 20 MG tablet Take 20 mg by mouth  daily. 12/15/17  Yes [provider]  gabapentin (NEURONTIN) 600 MG tablet Take 600-900 mg by mouth 3 (three) times daily. Patient takes 900mg  at bedtime 12/30/17  Yes [provider]  hydrochlorothiazide (HYDRODIURIL) 25 MG tablet Take 25 mg by mouth daily. 12/15/17  Yes [provider]  losartan (COZAAR) 50 MG tablet Take 50 mg by mouth daily. 12/15/17  Yes [provider]  meclizine (ANTIVERT) 25 MG tablet Take 25 mg by mouth 2 (two) times daily. 01/01/18  Yes [provider]  naproxen (NAPROSYN) 500 MG tablet Take 500 mg by mouth 2 (two) times daily. 12/15/17  Yes [provider]  nortriptyline (PAMELOR) 10 MG capsule Take 40 mg by mouth at bedtime. 12/30/17  Yes [provider]  omeprazole (PRILOSEC) 20 MG capsule Take 20 mg by mouth 2 (two) times daily. 12/15/17  Yes [provider]  traZODone (DESYREL) 100 MG tablet Take 100 mg by mouth at bedtime. 12/15/17  Yes [provider]  zolpidem (AMBIEN) 10 MG tablet Take 5-10 mg by mouth at bedtime as needed for sleep. 01/15/18  Yes [provider]    Inpatient Medications:  . atenolol  25 mg Oral Daily  . escitalopram  20 mg Oral Daily  . gabapentin  600 mg Oral TID  . losartan  50 mg Oral Daily  . meclizine  25 mg Oral BID  . nortriptyline  40 mg Oral QHS  . pantoprazole  40 mg Oral Daily   .  ceFAZolin (ANCEF) IV  Allergies: No Known Allergies  Social History   Socioeconomic History  . Marital status: Married    Spouse name: Not on file  . Number of children: Not on file  . Years of education: Not on file  . Highest education level: Not on file  Occupational History  . Not on file  Social Needs  . Financial resource strain: Not on file  . Food insecurity:    Worry: Not on file    Inability: Not on file  . Transportation needs:    Medical: Not on file    Non-medical: Not on file  Tobacco Use  . Smoking status: Never Smoker  .  Smokeless tobacco: Never Used  Substance and Sexual Activity  . Alcohol use: Never    Frequency: Never  . Drug use: Never  . Sexual activity: Not on file  Lifestyle  . Physical activity:    Days per week: Not on file    Minutes per session: Not on file  . Stress: Not on file  Relationships  . Social connections:    Talks on phone: Not on file    Gets together: Not on file    Attends religious service: Not on file    Active member of club or organization: Not on file    Attends meetings of clubs or organizations: Not on file    Relationship status: Not on file  . Intimate partner violence:    Fear of current or ex partner: Not on file    Emotionally abused: Not on file    Physically abused: Not on file    Forced sexual activity: Not on file  Other Topics Concern  . Not on file  Social History Narrative  . Not on file     Family History  Problem Relation Age of Onset  . Hypertension Father      Review of Systems Positive for pain Negative for: General:  chills, fever, night sweats or weight changes.  Cardiovascular: PND orthopnea syncope dizziness  Dermatological skin lesions rashes Respiratory: Cough congestion Urologic: Frequent urination urination at night and hematuria Abdominal: negative for nausea, vomiting, diarrhea, bright red blood per rectum, melena, or hematemesis Neurologic: negative for visual changes, and/or hearing changes  All other systems reviewed and are otherwise negative except as noted above.  Labs: No results for input(s): CKTOTAL, CKMB, TROPONINI in the last 72 hours. Lab Results  Component Value Date   WBC 10.9 (H) 01/16/2018   HGB 10.3 (L) 01/16/2018   HCT 32.8 (L) 01/16/2018   MCV 91.9 01/16/2018   PLT 201 01/16/2018    Recent Labs  Lab 01/15/18 2115 01/16/18 0502  NA 138 137  K 4.1 4.1  CL 102 102  CO2 25 26  BUN 20 20  CREATININE 1.22* 1.19*  CALCIUM 8.8* 8.6*  PROT 6.5  --   BILITOT 0.5  --   ALKPHOS 89  --   ALT 19   --   AST 28  --   GLUCOSE 145* 156*   Lab Results  Component Value Date   CHOL 196 01/16/2018   HDL 39 (L) 01/16/2018   LDLCALC 102 (H) 01/16/2018   TRIG 275 (H) 01/16/2018   No results found for: DDIMER  Radiology/Studies:  Dg Chest 1 View  Result Date: 01/15/2018 CLINICAL DATA:  Left hip fracture after a fall. EXAM: CHEST  1 VIEW COMPARISON:  None. FINDINGS: Normal heart size and pulmonary vascularity. No focal airspace disease or consolidation in the lungs. No  blunting of costophrenic angles. No pneumothorax. Mediastinal contours appear intact. Calcified granuloma in the left upper lung. Degenerative changes in the spine and shoulders. IMPRESSION: No active disease. Electronically Signed   By: Burman Nieves M.D.   On: 01/15/2018 22:12   Dg Hip Unilat With Pelvis 2-3 Views Left  Result Date: 01/15/2018 CLINICAL DATA:  Left hip pain and shortening of the left leg after a fall. EXAM: DG HIP (WITH OR WITHOUT PELVIS) 2-3V LEFT COMPARISON:  None. FINDINGS: Comminuted inter trochanteric fractures of the left proximal femur with varus angulation. Mildly displaced greater and lesser trochanteric fragments. Mild impaction of fracture fragments. No dislocation of the hip joint. Pelvis appears intact. SI joints and symphysis pubis are not displaced. Degenerative changes in the right hip. Postoperative changes in the lower lumbar spine. IMPRESSION: Acute comminuted intertrochanteric fractures of the left hip with varus angulation. Electronically Signed   By: Burman Nieves M.D.   On: 01/15/2018 22:11    EKG: Normal sinus rhythm with left axis deviation  Weights: Filed Weights   01/15/18 2102 01/16/18 0103  Weight: 91.6 kg 98.4 kg     Physical Exam: Blood pressure (!) 152/81, pulse 91, temperature 98.4 F (36.9 C), temperature source Oral, resp. rate 20, height 5\' 2"  (1.575 m), weight 98.4 kg, SpO2 94 %. Body mass index is 39.67 kg/m. General: Well developed, well nourished, in no acute  distress. Head eyes ears nose throat: Normocephalic, atraumatic, sclera non-icteric, no xanthomas, nares are without discharge. No apparent thyromegaly and/or mass  Lungs: Normal respiratory effort.  no wheezes, no rales, no rhonchi.  Heart: RRR with normal S1 S2.  With a 2-3+ sternal border murmur gallop, no rub, PMI is normal size and placement, carotid upstroke normal without bruit, jugular venous pressure is normal Abdomen: Soft, non-tender, non-distended with normoactive bowel sounds. No hepatomegaly. No rebound/guarding. No obvious abdominal masses. Abdominal aorta is normal size without bruit Extremities: Trace to 1+ edema. no cyanosis, no clubbing, no ulcers  Peripheral : 2+ bilateral upper extremity pulses, 2+ bilateral femoral pulses, 2+ bilateral dorsal pedal pulse Neuro: Alert and oriented. No facial asymmetry. No focal deficit. Moves all extremities spontaneously. Musculoskeletal: Normal muscle tone without kyphosis Psych:  Responds to questions appropriately with a normal affect.    Assessment: 85 year old female with essential hypertension mixed hyperlipidemia coronary artery disease status post previous stent placement without evidence of myocardial infarction anginal symptoms or congestive heart failure with a hip fracture.  There are no EKG changes or elevated troponin and the patient therefore is at lowest risk possible for cardiovascular complication with surgical intervention  Plan: 1.  Proceed to surgery without restriction for surgical intervention of hip fracture 2.  Continue atenolol and losartan for hypertension control perioperatively to reduce her cardiovascular complications 3.  Watch closely perioperatively as well as postoperatively for any significant rhythm disturbance or other symptoms that may require further intervention 4.  After ambulation will adjust medications as necessary  Signed, Lamar Blinks M.D. [facility] Cardiology 01/16/2018, 1:07  PM

## 2018-01-16 NOTE — Clinical Social Work Note (Signed)
Clinical Social Work Assessment  Patient Details  Name: Kelly Mcgrath MRN: [id number] Date of Birth: 05/29/1933  Date of referral:  01/16/18               Reason for consult:  Facility Placement                Permission sought to share information with:  Chartered certified accountant granted to share information::  Yes, Verbal Permission Granted  Name::      [place]::   [place]   Relationship::     Contact Information:     Housing/Transportation Living arrangements for the past 2 months:  [place] of Information:  Patient Patient Interpreter Needed:  None Criminal Activity/Legal Involvement Pertinent to Current Situation/Hospitalization:  No - Comment as needed Significant Relationships:  Spouse Lives with:  Spouse Do you feel safe going back to the place where you live?  Yes Need for family participation in patient care:  Yes (Comment)  Care giving concerns:  Patient and her husband Kelly Mcgrath recently moved to [facility] independent living in [place], [place] from [place].    Social Worker assessment / plan:  Holiday representative ([place]) reviewed chart and noted that patient has a hip fracture. Surgery and PT are pending. CSW met with patient alone at bedside to discuss D/C plan. Patient was alert and oriented X4 and was laying in the bed. CSW introduced self and explained role of CSW department. Per patient her and her husband just moved to [facility] from [place] and have not finished unpacking. CSW explained that after surgery PT will evaluate patient and make a recommendation of home health or SNF. CSW explained that [employer] will have to approve SNF. Patient reported that she will likely need to go to SNF. Per patient she has been to SNF before in [place] but not in [place]. Patient is agreeable to SNF search in [facility]. FL2 complete and faxed out. CSW will continue to follow and assist as needed.   Employment  status:  Disabled (Comment on whether or not currently receiving Disability), Retired Nurse, adult PT Recommendations:  Not assessed at this time Information / Referral to community resources:  [place]  Patient/Family's Response to care:  Patient is agreeable to [facility] in [place].   Patient/Family's Understanding of and Emotional Response to Diagnosis, Current Treatment, and Prognosis:  Patient was very pleasant and thanked CSW for assistance.   Emotional Assessment Appearance:  Appears stated age Attitude/Demeanor/Rapport:    Affect (typically observed):  Accepting, Adaptable, Pleasant Orientation:  Oriented to Self, Oriented to Place, Oriented to  Time, Oriented to Situation Alcohol / Substance use:  Not Applicable Psych involvement (Current and /or in the community):  No (Comment)  Discharge Needs  Concerns to be addressed:  Discharge Planning Concerns Readmission within the last 30 days:  No Current discharge risk:  Dependent with Mobility Barriers to Discharge:  Continued Medical Work up   [employer], Veronia Beets, LCSW 01/16/2018, 2:27 PM

## 2018-01-16 NOTE — Op Note (Signed)
OPERATIVE NOTE  DATE OF SURGERY:  01/16/2018  PATIENT NAME:  Kelly Mcgrath   DOB: 05-21-1933  MRN: [id number]  PRE-OPERATIVE DIAGNOSIS: Left intertrochanteric femur fracture  POST-OPERATIVE DIAGNOSIS:  Same  PROCEDURE: Open reduction and internal fixation of a left intertrochanteric femur fracture   SURGEON:  Jena Gauss. M.D.  ANESTHESIA: spinal  ESTIMATED BLOOD LOSS: 100 mL  FLUIDS REPLACED: 300 mL of crystalloid  DRAINS: None  IMPLANTS UTILIZED: Synthes 11 mm x 380 mm/130 trochanteric fixation nail, 105 mm helical blade, 42 mm x 5.0 mm locking screw  INDICATIONS FOR SURGERY: Kelly Mcgrath is a 85 y.o. year old female who fell and sustained a displaced left intertrochanteric femur fracture. After discussion of the risks and benefits of surgical intervention, the patient expressed understanding of the risks benefits and agree with plans for open reduction and internal fixation.   The risks, benefits, and alternatives were discussed at length including but not limited to the risks of infection, bleeding, nerve injury, stiffness, blood clots, the need for revision surgery, limb length inequality, cardiopulmonary complications, among others, and they were willing to proceed.  PROCEDURE IN DETAIL: The patient was brought into the operating room and, after adequate spinal anesthesia was achieved, patient was placed on the fracture table. All bony prominences were well-padded. The left lower extremity was placed in traction and a provisional reduction was performed and verified using the C-arm. The patient's left hip and leg were cleaned and prepped with alcohol and DuraPrep and draped in the usual sterile fashion. A "timeout" was performed as per usual protocol. A lateral incision was made extended from the proximal portion of the greater trochanter proximally. The fascia was incised in line with the skin incision and the fibers of the hip abductors were split in line. The tip of  the greater trochanter was palpated and a distally threaded guide pin was inserted into the tip of the greater trochanter and advanced into the medullary canal. Position was confirmed in both AP and lateral planes using the C-arm. A pilot hole was enlarged using a step drill.  A distally beaded guidewire was used to replace the initial guidewire and the length was measured.  It was felt that a 380 mm nail was appropriate.  The intramedullary canal was then reamed in sequential fashion up to a 12 mm diameter.  A Synthes 11 mm x 380 mm/130 trochanteric fixation nail was advanced over the guidewire and position confirmed using the C-arm. A second stab incision was made and the tissue protector was inserted through the outrigger device and advanced to the lateral cortex of the femur. A threaded screw guide pin was inserted into the femoral neck and head and position was again confirmed in both AP and lateral planes. Vision was were obtained and it was felt that a 105 mm helical blade was appropriate. The cortex was reamed and then a cannulated reamer was advanced over the guidepin to the appropriate depth. A 105 mm helical blade was then advanced over the guidepin and impacted into place. Good position was noted in multiple planes using the C-arm. The locking sleeve was engaged.  The C arm was then positioned so as to obtain a lateral view at the level of the knee for visualization of the distal locking screws.  A third stab incision was made and a drill was advanced the lateral cortex of the femur for placement of the distal locking screw. A 42 mm x 5.0 mm locking screw was then inserted.  The outrigger device was removed. The hip was visualized in all planes using the C-arm with good reduction appreciated and good position of the hardware noted.  The wound was irrigated with copious amounts of normal saline with antibiotic solution and suctioned dry. Good hemostasis was appreciated. The fascia was reapproximated  using interrupted sutures of #1 Vicryl. Subcutaneous tissue was approximated layers using first #0 Vicryl followed #2-0 Vicryl. The skin was closed with skin staples. A sterile dressing was applied.  The patient tolerated the procedure well and was transported to the recovery room in stable condition.   Jena Gauss., M.D.

## 2018-01-16 NOTE — NC FL2 (Addendum)
[place] MEDICAID FL2 LEVEL OF CARE SCREENING TOOL     IDENTIFICATION  Patient Name: Kelly Mcgrath Birthdate: [birth year] Sex: female Admission Date (Current Location): 01/15/2018  [place]   Facility and Address:  [facility], [address], [place], [place] [address]      Provider Number: [id number]  Attending Physician Name and Address:  Jama Flavorsjie, Jude, MD  Relative Name and Phone Number:       Current Level of Care: Hospital Recommended Level of Care: Skilled Nursing Facility Prior Approval Number:    Date Approved/Denied:   PASRR Number: ([id number])  Discharge Plan: SNF    Current Diagnoses: Patient Active Problem List   Diagnosis Date Noted  . Hip fracture (HCC) 01/15/2018    Orientation RESPIRATION BLADDER Height & Weight     Self, Time, Situation, Place  Normal Continent Weight: 216 lb 14.4 oz (98.4 kg) Height:  5\' 2"  (157.5 cm)  BEHAVIORAL SYMPTOMS/MOOD NEUROLOGICAL BOWEL NUTRITION STATUS      Continent Diet(Diet: NPO for surgery to be advanced. )  AMBULATORY STATUS COMMUNICATION OF NEEDS Skin   Extensive Assist Verbally Surgical wounds                       Personal Care Assistance Level of Assistance  Bathing, Feeding, Dressing Bathing Assistance: Limited assistance Feeding assistance: Independent Dressing Assistance: Limited assistance     Functional Limitations Info  Sight, Hearing, Speech Sight Info: Adequate Hearing Info: Adequate Speech Info: Adequate    SPECIAL CARE FACTORS FREQUENCY  PT (By licensed PT), OT (By licensed OT)     PT Frequency: (5) OT Frequency: (5)            Contractures      Additional Factors Info  Code Status, Allergies Code Status Info: (DNR ) Allergies Info: (No Known Allergies. )           Current Medications (01/16/2018):  This is the current hospital active medication list Current Facility-Administered Medications  Medication Dose  Route Frequency Provider Last Rate Last Dose  . atenolol (TENORMIN) tablet 25 mg  25 mg Oral Daily Altamese DillingVachhani, Vaibhavkumar, MD      . ceFAZolin (ANCEF) IVPB 2g/100 mL premix  2 g Intravenous To OR Hooten, Illene LabradorJames P, MD      . docusate sodium (COLACE) capsule 100 mg  100 mg Oral BID PRN Altamese DillingVachhani, Vaibhavkumar, MD      . escitalopram (LEXAPRO) tablet 20 mg  20 mg Oral Daily Altamese DillingVachhani, Vaibhavkumar, MD      . gabapentin (NEURONTIN) capsule 300 mg  300 mg Oral TID Ojie, Jude, MD      . HYDROmorphone (DILAUDID) injection 0.5 mg  0.5 mg Intravenous Q2H PRN Hooten, Illene LabradorJames P, MD      . losartan (COZAAR) tablet 50 mg  50 mg Oral Daily Altamese DillingVachhani, Vaibhavkumar, MD      . meclizine (ANTIVERT) tablet 25 mg  25 mg Oral BID Altamese DillingVachhani, Vaibhavkumar, MD      . nortriptyline (PAMELOR) capsule 40 mg  40 mg Oral QHS Altamese DillingVachhani, Vaibhavkumar, MD      . oxyCODONE (Oxy IR/ROXICODONE) immediate release tablet 5-10 mg  5-10 mg Oral Q4H PRN Donato HeinzHooten, James P, MD   10 mg at 01/16/18 0842  . pantoprazole (PROTONIX) EC tablet 40 mg  40 mg Oral Daily Altamese DillingVachhani, Vaibhavkumar, MD      . zolpidem (AMBIEN) tablet 5 mg  5 mg Oral QHS PRN Altamese DillingVachhani, Vaibhavkumar, MD  Discharge Medications: Please see discharge summary for a list of discharge medications.  Relevant Imaging Results:  Relevant Lab Results:   Additional Information (SSN: [id number])  Sample, Darleen Crocker, LCSW

## 2018-01-16 NOTE — Anesthesia Preprocedure Evaluation (Addendum)
Anesthesia Evaluation  Patient identified by MRN, date of birth, ID band Patient awake    Reviewed: Allergy & Precautions, NPO status , Patient's Chart, lab work & pertinent test results  History of Anesthesia Complications (+) history of anesthetic complications (Post-op confusion after 8 hr back surgery)  Airway Mallampati: III       Dental   Pulmonary neg sleep apnea, neg COPD,           Cardiovascular + CAD, + Cardiac Stents and +CHF  (-) dysrhythmias (-) Valvular Problems/Murmurs     Neuro/Psych neg Seizures    GI/Hepatic Neg liver ROS, neg GERD  ,  Endo/Other  neg diabetes  Renal/GU      Musculoskeletal   Abdominal   Peds  Hematology   Anesthesia Other Findings   Reproductive/Obstetrics                            Anesthesia Physical Anesthesia Plan  ASA: III and emergent  Anesthesia Plan: Spinal   Post-op Pain Management:    Induction: Intravenous  PONV Risk Score and Plan: 2  Airway Management Planned: Simple Face Mask and Nasal Cannula  Additional Equipment:   Intra-op Plan:   Post-operative Plan:   Informed Consent: I have reviewed the patients History and Physical, chart, labs and discussed the procedure including the risks, benefits and alternatives for the proposed anesthesia with the patient or authorized representative who has indicated his/her understanding and acceptance.       Plan Discussed with:   Anesthesia Plan Comments: (We would like cardiac clearance for this patient.  I spoke with the patients nurse at 0914 who said that she would let the primary team know)       Anesthesia Quick Evaluation

## 2018-01-16 NOTE — Progress Notes (Signed)
[facility]   PATIENT NAME: Kelly Mcgrath    MR#:  [id number]  DATE OF BIRTH:  10/01/1933  SUBJECTIVE:  CHIEF COMPLAINT:   Chief Complaint  Patient presents with  . Fall    No new complaint this morning.  Patient presented with fall and sustained left hip fracture.  Traction in place at this time.  Scheduled for surgery later this evening. Patient evaluated by anesthesiologist who recommended preop cardiac clearance given history of CAD status post stent placement about 2 years ago.  Cardiology consult placed.  Patient denies any chest pain.  No shortness of breath.  REVIEW OF SYSTEMS:  Review of Systems  Constitutional: Negative for chills and fever.  HENT: Negative for hearing loss and tinnitus.   Eyes: Negative for blurred vision and double vision.  Respiratory: Negative for cough and hemoptysis.   Cardiovascular: Negative for chest pain and palpitations.  Gastrointestinal: Negative for heartburn, nausea and vomiting.  Genitourinary: Negative for dysuria and urgency.  Musculoskeletal: Negative for myalgias and neck pain.  Skin: Negative for itching and rash.  Neurological: Negative for dizziness and headaches.  Psychiatric/Behavioral: Negative for depression and substance abuse.    DRUG ALLERGIES:  No Known Allergies VITALS:  Blood pressure (!) 152/81, pulse 91, temperature 98.4 F (36.9 C), temperature source Oral, resp. rate 20, height 5\' 2"  (1.575 m), weight 98.4 kg, SpO2 94 %. PHYSICAL EXAMINATION:   Physical Exam  Constitutional: She is oriented to person, place, and time. She appears well-developed and well-nourished.  HENT:  Head: Normocephalic and atraumatic.  Eyes: Pupils are equal, round, and reactive to light. Conjunctivae and EOM are normal.  Neck: Normal range of motion. Neck supple. No thyromegaly present.  Cardiovascular: Normal rate and normal heart sounds.  Respiratory: Effort normal and breath sounds  normal. She has no wheezes. She has no rales.  GI: Soft. Bowel sounds are normal. She exhibits no distension. There is no abdominal tenderness.  Musculoskeletal:     Comments: Traction in place to left lower extremity secondary to left hip fracture.  Neurological: She is alert and oriented to person, place, and time. No cranial nerve deficit.  Skin: Skin is warm. No erythema.  Psychiatric: She has a normal mood and affect.   LABORATORY PANEL:  Female CBC Recent Labs  Lab 01/16/18 0502  WBC 10.9*  HGB 10.3*  HCT 32.8*  PLT 201   ------------------------------------------------------------------------------------------------------------------ Chemistries  Recent Labs  Lab 01/15/18 2115 01/16/18 0502  NA 138 137  K 4.1 4.1  CL 102 102  CO2 25 26  GLUCOSE 145* 156*  BUN 20 20  CREATININE 1.22* 1.19*  CALCIUM 8.8* 8.6*  AST 28  --   ALT 19  --   ALKPHOS 89  --   BILITOT 0.5  --    RADIOLOGY:  Dg Chest 1 View  Result Date: 01/15/2018 CLINICAL DATA:  Left hip fracture after a fall. EXAM: CHEST  1 VIEW COMPARISON:  None. FINDINGS: Normal heart size and pulmonary vascularity. No focal airspace disease or consolidation in the lungs. No blunting of costophrenic angles. No pneumothorax. Mediastinal contours appear intact. Calcified granuloma in the left upper lung. Degenerative changes in the spine and shoulders. IMPRESSION: No active disease. Electronically Signed   By: Burman Nieves M.D.   On: 01/15/2018 22:12   Dg Hip Unilat With Pelvis 2-3 Views Left  Result Date: 01/15/2018 CLINICAL DATA:  Left hip pain and shortening of the left leg after a  fall. EXAM: DG HIP (WITH OR WITHOUT PELVIS) 2-3V LEFT COMPARISON:  None. FINDINGS: Comminuted inter trochanteric fractures of the left proximal femur with varus angulation. Mildly displaced greater and lesser trochanteric fragments. Mild impaction of fracture fragments. No dislocation of the hip joint. Pelvis appears intact. SI joints and  symphysis pubis are not displaced. Degenerative changes in the right hip. Postoperative changes in the lower lumbar spine. IMPRESSION: Acute comminuted intertrochanteric fractures of the left hip with varus angulation. Electronically Signed   By: Burman NievesWilliam  Stevens M.D.   On: 01/15/2018 22:11   ASSESSMENT AND PLAN:   1.  Fall with left hip fracture Patient already seen by orthopedic physician with plans for surgery later this evening. Evaluated by anesthesiologist who recommended cardiology consultation for preop clearance prior to surgery given history of CAD with prior stent placement and history of CHF. Patient completely asymptomatic.  No chest pain.  No shortness of breath. Patient seen by cardiologist who recommended proceeding with surgery without restriction for surgical intervention of the left hip fracture.  To continue losartan and atenolol for hypertension control perioperatively to reduce her cardiovascular complications.  To monitor patient closely. Monitor fluid status perioperatively to prevent fluid overload given underlying history of CHF   2.  History of coronary artery disease  Patient reports prior stent placement at least 2 years ago.  Patient reports being on baby aspirin daily but not on Plavix. Awaiting husband who will bring her most up-to-date medication list. Anticipate resuming aspirin postop once okay with orthopedic physician  3.Hypertension Continue home medications and adjust dose as needed  4.  History of chronic CHF; unspecified type No prior 2D echocardiogram in chart. Appears euvolemic.  Clinically stable at this time.  Being followed by cardiologist.  5.  History of Menier's disease Continue meclizine.  6.  Renal insufficiency with creatinine of 1.22 Chronicity undetermined.  Possibly acute no prior labs in the chart.  Creatinine down to 1.19 this morning.  Monitor closely.   DVT prophylaxis; SCDs To initiate Lovenox after surgery    All the  records are reviewed and case discussed with Care Management/Social Worker. Management plans discussed with the patient, family and they are in agreement.  CODE STATUS: DNR  TOTAL TIME TAKING CARE OF THIS PATIENT: 36 minutes.   More than 50% of the time was spent in counseling/coordination of care: YES  POSSIBLE D/C IN 2 DAYS, DEPENDING ON CLINICAL CONDITION.   Eleena Grater M.D on 01/16/2018 at 1:11 PM  Between 7am to 6pm - Pager - [phone]  After 6pm go to [email or web address] - Social research officer, governmentpassword [email or web address] [email or web address]  [facility]  Office  [phone]  CC: Primary care physician; Patient, No Pcp Per  Note: This dictation was prepared with Dragon dictation along with smaller phrase technology. Any transcriptional errors that result from this process are unintentional.

## 2018-01-16 NOTE — Progress Notes (Signed)
Family Meeting Note  Advance Directive:yes  Today a meeting took place with the Patient.  The following clinical team members were present during this meeting:MD  The following were discussed:Patient's diagnosis: Hip fracture, coronary artery disease, CHF, hypertension, Patient's progosis: Unable to determine and Goals for treatment: DNR  Additional follow-up to be provided: Orthopedic surgery.  Time spent during discussion:20 minutes  Altamese Dilling, MD

## 2018-01-16 NOTE — Transfer of Care (Signed)
Immediate Anesthesia Transfer of Care Note  Patient: Kelly Mcgrath  Procedure(s) Performed: INTRAMEDULLARY (IM) NAIL INTERTROCHANTRIC, ORIF FEMUR-LEFT (Left Hip)  Patient Location: PACU  Anesthesia Type:Spinal  Level of Consciousness: awake and alert   Airway & Oxygen Therapy: Patient Spontanous Breathing  Post-op Assessment: Report given to RN and Post -op Vital signs reviewed and stable  Post vital signs: Reviewed  Last Vitals:  Vitals Value Taken Time  BP 99/61 01/16/2018  9:19 PM  Temp 36.7 C 01/16/2018  9:18 PM  Pulse 81 01/16/2018  9:19 PM  Resp 10 01/16/2018  9:19 PM  SpO2 98 % 01/16/2018  9:19 PM  Vitals shown include unvalidated device data.  Last Pain:  Vitals:   01/16/18 1721  TempSrc:   PainSc: 5          Complications: No apparent anesthesia complications

## 2018-01-16 NOTE — Anesthesia Post-op Follow-up Note (Signed)
Anesthesia QCDR form completed.        

## 2018-01-17 ENCOUNTER — Encounter: Payer: Self-pay | Admitting: Orthopedic Surgery

## 2018-01-17 LAB — BASIC METABOLIC PANEL
Anion gap: 9 (ref 5–15)
BUN: 16 mg/dL (ref 8–23)
CO2: 25 mmol/L (ref 22–32)
Calcium: 8.2 mg/dL — ABNORMAL LOW (ref 8.9–10.3)
Chloride: 103 mmol/L (ref 98–111)
Creatinine, Ser: 1.13 mg/dL — ABNORMAL HIGH (ref 0.44–1.00)
GFR calc Af Amer: 52 mL/min — ABNORMAL LOW (ref >60)
GFR calc non Af Amer: 45 mL/min — ABNORMAL LOW (ref >60)
Glucose, Bld: 193 mg/dL — ABNORMAL HIGH (ref 70–99)
Potassium: 3.7 mmol/L (ref 3.5–5.1)
SODIUM: 137 mmol/L (ref 135–145)

## 2018-01-17 LAB — CBC
HCT: 29.9 % — ABNORMAL LOW (ref 36.0–46.0)
Hemoglobin: 9.4 g/dL — ABNORMAL LOW (ref 12.0–15.0)
MCH: 28.9 pg (ref 26.0–34.0)
MCHC: 31.4 g/dL (ref 30.0–36.0)
MCV: 92 fL (ref 80.0–100.0)
Platelets: 196 10*3/uL (ref 150–400)
RBC: 3.25 MIL/uL — ABNORMAL LOW (ref 3.87–5.11)
RDW: 13.4 % (ref 11.5–15.5)
WBC: 12 10*3/uL — AB (ref 4.0–10.5)
nRBC: 0 % (ref 0.0–0.2)

## 2018-01-17 LAB — MAGNESIUM: Magnesium: 1.6 mg/dL — ABNORMAL LOW (ref 1.7–2.4)

## 2018-01-17 MED ORDER — ASPIRIN EC 81 MG PO TBEC
81.0000 mg | DELAYED_RELEASE_TABLET | Freq: Every day | ORAL | Status: DC
  Administered 2018-01-18 – 2018-01-19 (×2): 81 mg via ORAL
  Filled 2018-01-17 (×2): qty 1

## 2018-01-17 MED ORDER — HYDROCHLOROTHIAZIDE 25 MG PO TABS
25.0000 mg | ORAL_TABLET | Freq: Every day | ORAL | Status: DC
  Administered 2018-01-18 – 2018-01-19 (×2): 25 mg via ORAL
  Filled 2018-01-17 (×2): qty 1

## 2018-01-17 MED ORDER — MAGNESIUM SULFATE IN D5W 1-5 GM/100ML-% IV SOLN
1.0000 g | Freq: Once | INTRAVENOUS | Status: AC
  Administered 2018-01-18: 1 g via INTRAVENOUS
  Filled 2018-01-17: qty 100

## 2018-01-17 MED ORDER — TRAZODONE HCL 100 MG PO TABS
100.0000 mg | ORAL_TABLET | Freq: Every day | ORAL | Status: DC
  Administered 2018-01-17 – 2018-01-18 (×2): 100 mg via ORAL
  Filled 2018-01-17 (×2): qty 1

## 2018-01-17 NOTE — Progress Notes (Signed)
Physical Therapy Treatment Patient Details Name: Kelly Mcgrath MRN: [id number] DOB: [birth year] Today's Date: 01/17/2018    History of Present Illness Pt is an 85 y.o. female with a known history of CHF, coronary artery disease, neuropathy, Mnire's disease.  Pt has significant neuropathy and also significant Mnire's disease chronically for last 10 years.  At her facility while walking she suddenly lost her balance due to significant vertigo and fell on the floor with pain in the left hip.  Pt noted to have intertrochanteric fracture on the left hip and is now s/p ORIF.     PT Comments    Pt presents with deficits in strength, transfers, mobility, gait, balance, and activity tolerance.  Pt continues to require extensive +2 assist for bed mobility tasks but stood from an elevated EOB with decreased assistance and with improved control.  Once in standing the pt was able to lift her L foot from the floor with improved clearance and was able to minimally pivot on the R foot but not well enough to attempt a SPT to the recliner safely.  Pt motivated to participate during the session and should continue to make progress towards PT goals.  Pt will benefit from PT services in a SNF setting upon discharge to safely address above deficits for decreased caregiver assistance and eventual return to PLOF.     Follow Up Recommendations  SNF;Supervision for mobility/OOB     Equipment Recommendations  Other (comment)(TBD at next venue of care)    Recommendations for Other Services       Precautions / Restrictions Precautions Precautions: Fall Restrictions Weight Bearing Restrictions: Yes LLE Weight Bearing: Weight bearing as tolerated Other Position/Activity Restrictions: Pt c/o dizziness/lightheadedness in standing    Mobility  Bed Mobility Overal bed mobility: Needs Assistance Bed Mobility: Supine to Sit;Sit to Supine     Supine to sit: +2 for physical assistance;Max assist Sit to supine:  +2 for physical assistance;Max assist   General bed mobility comments: Mod verbal cues for sequencing  Transfers Overall transfer level: Needs assistance Equipment used: Rolling walker (2 wheeled) Transfers: Sit to/from Stand Sit to Stand: Mod assist;From elevated surface;+2 physical assistance;Min assist         General transfer comment: Mod verbal and tactile cues for hand placement and general sequencing  Ambulation/Gait             General Gait Details: Pt able to lift L foot from the floor with improved clearance this session and was able to minimally pivot on the R foot but not well enough to attempt a SPT to the recliner safely.     Stairs             Wheelchair Mobility    Modified Rankin (Stroke Patients Only)       Balance Overall balance assessment: Needs assistance;History of Falls   Sitting balance-Leahy Scale: Good     Standing balance support: Bilateral upper extremity supported Standing balance-Leahy Scale: Fair Standing balance comment: Heavy reliance on the walker for support                            Cognition Arousal/Alertness: Awake/alert Behavior During Therapy: WFL for tasks assessed/performed Overall Cognitive Status: Within Functional Limits for tasks assessed  Exercises Total Joint Exercises Ankle Circles/Pumps: Strengthening;Both;10 reps;15 reps Quad Sets: Strengthening;Both;10 reps Gluteal Sets: Strengthening;Both;10 reps Heel Slides: AROM;AAROM;Both;5 reps(AAROM on the LLE) Hip ABduction/ADduction: AROM;AAROM;Both;10 reps(AAROM on the LLE) Straight Leg Raises: AROM;AAROM;Both;10 reps(AAROM on the LLE) Long Arc Quad: AROM;Both;10 reps Knee Flexion: AROM;Both;10 reps    General Comments        Pertinent Vitals/Pain Pain Assessment: 0-10 Pain Score: 3  Pain Location: L hip Pain Descriptors / Indicators: Aching;Sore Pain Intervention(s): Monitored  during session;Premedicated before session    Home Living                      Prior Function            PT Goals (current goals can now be found in the care plan section) Progress towards PT goals: Progressing toward goals    Frequency    BID      PT Plan Current plan remains appropriate    Co-evaluation              AM-PAC PT "6 Clicks" Mobility   Outcome Measure  Help needed turning from your back to your side while in a flat bed without using bedrails?: A Lot Help needed moving from lying on your back to sitting on the side of a flat bed without using bedrails?: A Lot Help needed moving to and from a bed to a chair (including a wheelchair)?: A Lot Help needed standing up from a chair using your arms (e.g., wheelchair or bedside chair)?: A Lot Help needed to walk in hospital room?: Total Help needed climbing 3-5 steps with a railing? : Total 6 Click Score: 10    End of Session Equipment Utilized During Treatment: Gait belt Activity Tolerance: Patient tolerated treatment well Patient left: in bed;with call bell/phone within reach;with bed alarm set;with SCD's reapplied Nurse Communication: Mobility status PT Visit Diagnosis: Unsteadiness on feet (R26.81);History of falling (Z91.81);Muscle weakness (generalized) (M62.81);Other abnormalities of gait and mobility (R26.89)     Time: 1349-1415 PT Time Calculation (min) (ACUTE ONLY): 26 min  Charges:  $Therapeutic Exercise: 8-22 mins $Therapeutic Activity: 8-22 mins                     D. Scott Arshi Duarte PT, DPT 01/17/18, 2:52 PM

## 2018-01-17 NOTE — Progress Notes (Signed)
PT is recommending SNF. Clinical Social Worker (CSW) met with patient and her husband Kelly Mcgrath was at bedside. CSW presented bed offers to patient and husband. They chose [facility]. CSW started [facility] SNF authorization through [facility].   [facility] SNF authorization through [facility] health was received, authorization # [id number]. Plan is for patient to D/C to [facility] over the weekend if medically stable. Patient and her husband are aware of above. [facility] liaison is aware of above.   [employer], LCSW [phone]

## 2018-01-17 NOTE — Progress Notes (Signed)
[facility] Encounter Note  Patient: Kelly Mcgrath / Admit Date: 01/15/2018 / Date of Encounter: 01/17/2018, 8:23 AM   Subjective: Patient is now status post fractured hip surgery with success and no evidence of cardiac complication.  Patient's not had any chest pain shortness of breath or heart failure type symptoms.  Blood pressure stable at this time.  Patient will begin ambulation and rehabilitation  Review of Systems: Positive for: Hip pain Negative for: Vision change, hearing change, syncope, dizziness, nausea, vomiting,diarrhea, bloody stool, stomach pain, cough, congestion, diaphoresis, urinary frequency, urinary pain,skin lesions, skin rashes Others previously listed  Objective: Telemetry: Normal sinus rhythm Physical Exam: Blood pressure (!) 119/57, pulse 96, temperature 98.1 F (36.7 C), temperature source Oral, resp. rate 16, height 5\' 2"  (1.575 m), weight 98.4 kg, SpO2 93 %. Body mass index is 39.67 kg/m. General: Well developed, well nourished, in no acute distress. Head: Normocephalic, atraumatic, sclera non-icteric, no xanthomas, nares are without discharge. Neck: No apparent masses Lungs: Normal respirations with no wheezes, no rhonchi, no rales , no crackles   Heart: Regular rate and rhythm, normal S1 S2, no murmur, no rub, no gallop, PMI is normal size and placement, carotid upstroke normal without bruit, jugular venous pressure normal Abdomen: Soft, non-tender, non-distended with normoactive bowel sounds. No hepatosplenomegaly. Abdominal aorta is normal size without bruit Extremities: No edema, no clubbing, no cyanosis, no ulcers,  Peripheral: 2+ radial, 2+ femoral, 2+ dorsal pedal pulses Neuro: Alert and oriented. Moves all extremities spontaneously. Psych:  Responds to questions appropriately with a normal affect.   Intake/Output Summary (Last 24 hours) at 01/17/2018 0823 Last data filed at 01/16/2018 2217 Gross per 24 hour  Intake 500 ml   Output 900 ml  Net -400 ml    Inpatient Medications:  . atenolol  25 mg Oral Daily  . celecoxib  200 mg Oral BID  . enoxaparin (LOVENOX) injection  40 mg Subcutaneous Q24H  . escitalopram  20 mg Oral Daily  . ferrous sulfate  325 mg Oral BID WC  . gabapentin  300 mg Oral TID  . gabapentin  300 mg Oral QHS  . losartan  50 mg Oral Daily  . meclizine  25 mg Oral BID  . metoCLOPramide  10 mg Oral TID AC & HS  . nortriptyline  40 mg Oral QHS  . pantoprazole  40 mg Oral BID  . senna-docusate  1 tablet Oral BID   Infusions:  . sodium chloride 100 mL/hr at 01/16/18 2301  . acetaminophen 1,000 mg (01/17/18 0609)  .  ceFAZolin (ANCEF) IV 2 g (01/16/18 2306)  . magnesium sulfate 1 - 4 g bolus IVPB      Labs: Recent Labs    01/16/18 0502 01/17/18 0355  NA 137 137  K 4.1 3.7  CL 102 103  CO2 26 25  GLUCOSE 156* 193*  BUN 20 16  CREATININE 1.19* 1.13*  CALCIUM 8.6* 8.2*  MG  --  1.6*   Recent Labs    01/15/18 2115  AST 28  ALT 19  ALKPHOS 89  BILITOT 0.5  PROT 6.5  ALBUMIN 4.0   Recent Labs    01/15/18 2115 01/16/18 0502 01/17/18 0355  WBC 10.0 10.9* 12.0*  NEUTROABS 5.1  --   --   HGB 10.9* 10.3* 9.4*  HCT 34.6* 32.8* 29.9*  MCV 92.8 91.9 92.0  PLT 232 201 196   No results for input(s): CKTOTAL, CKMB, TROPONINI in the last 72 hours. Invalid input(s): POCBNP  No results for input(s): HGBA1C in the last 72 hours.   Weights: Filed Weights   01/15/18 2102 01/16/18 0103  Weight: 91.6 kg 98.4 kg     Radiology/Studies:  Dg Chest 1 View  Result Date: 01/15/2018 CLINICAL DATA:  Left hip fracture after a fall. EXAM: CHEST  1 VIEW COMPARISON:  None. FINDINGS: Normal heart size and pulmonary vascularity. No focal airspace disease or consolidation in the lungs. No blunting of costophrenic angles. No pneumothorax. Mediastinal contours appear intact. Calcified granuloma in the left upper lung. Degenerative changes in the spine and shoulders. IMPRESSION: No active  disease. Electronically Signed   By: Burman Nieves M.D.   On: 01/15/2018 22:12   Dg Hip Operative Unilat W Or W/o Pelvis Left  Result Date: 01/16/2018 CLINICAL DATA:  Postop left hip EXAM: OPERATIVE left HIP (WITH PELVIS IF PERFORMED) two VIEWS TECHNIQUE: Fluoroscopic spot image(s) were submitted for interpretation post-operatively. FLUOROSCOPY TIME:  21.75 mGy COMPARISON:  Left hip radiographs dated 01/15/2018 FINDINGS: IM nail with dynamic hip screw fixation of an intertrochanteric left hip fracture. Fracture fragments are in near anatomic alignment and position. Minimally displaced lesser trochanteric fragment. IMPRESSION: Status post ORIF of a left hip fracture, as above. Electronically Signed   By: Charline Bills M.D.   On: 01/16/2018 23:23   Dg Hip Unilat With Pelvis 2-3 Views Left  Result Date: 01/15/2018 CLINICAL DATA:  Left hip pain and shortening of the left leg after a fall. EXAM: DG HIP (WITH OR WITHOUT PELVIS) 2-3V LEFT COMPARISON:  None. FINDINGS: Comminuted inter trochanteric fractures of the left proximal femur with varus angulation. Mildly displaced greater and lesser trochanteric fragments. Mild impaction of fracture fragments. No dislocation of the hip joint. Pelvis appears intact. SI joints and symphysis pubis are not displaced. Degenerative changes in the right hip. Postoperative changes in the lower lumbar spine. IMPRESSION: Acute comminuted intertrochanteric fractures of the left hip with varus angulation. Electronically Signed   By: Burman Nieves M.D.   On: 01/15/2018 22:11   Dg Femur Port Min 2 Views Left  Result Date: 01/16/2018 CLINICAL DATA:  ORIF left hip fracture EXAM: LEFT FEMUR PORTABLE 2 VIEWS COMPARISON:  None. FINDINGS: IM nail with dynamic hip screw fixation of intertrochanteric left hip fracture. Distal interlocking screw. Adjacent skin staples. Mild tricompartmental degenerative changes of the knee, most prominent at the lateral and patellofemoral  compartments. IMPRESSION: Status post ORIF of a left hip fracture, as above. Electronically Signed   By: Charline Bills M.D.   On: 01/16/2018 23:24     Assessment and Recommendation  85 y.o. female with known cardiovascular disease status post previous PCI and stent placement essential hypertension mixed hyperlipidemia chronic kidney disease stage III with anemia and fall with hip fracture without evidence of myocardial infarction or congestive heart failure 1.  Begin rehabilitation without restriction 2.  No further cardiac diagnostics necessary at this time due to no heart failure or myocardial infarction or anginal symptoms 3.  Continue hypertension control with current medical regimen 4.  High intensity cholesterol therapy 5.  Okay for discharge home from cardiac standpoint to rehabilitation and follow-up next 2 weeks for further adjustment of medication management from the cardiac standpoint  Signed, Arnoldo Hooker M.D. FACC

## 2018-01-17 NOTE — Evaluation (Signed)
Physical Therapy Evaluation Patient Details Name: Kelly Mcgrath MRN: [id number] DOB: 04-17-1933 Today's Date: 01/17/2018   History of Present Illness  Pt is an 85 y.o. female with a known history of CHF, coronary artery disease, neuropathy, Mnire's disease.  Pt has significant neuropathy and also significant Mnire's disease chronically for last 10 years.  At her facility while walking she suddenly lost her balance due to significant vertigo and fell on the floor with pain in the left hip.  Pt noted to have intertrochanteric fracture on the left hip and is now s/p ORIF.     Clinical Impression  Pt presents with deficits in strength, transfers, mobility, gait, balance, and activity tolerance.  Pt required +2 max assist with bed mobility tasks and +2 mod assist with sit to/from stand transfers from an elevated EOB.  Once in standing pt was able to minimally lift her left foot from the floor 1-2 times but could not advance or even move her RLE.  While standing pt began to c/o feeling dizzy and lightheaded and returned to sitting.  The pt reported feeling as if she could "pass out" and was returned to supine with BP taken at 132/60 mmHg.  Pt will benefit from PT services in a SNF setting upon discharge to safely address above deficits for decreased caregiver assistance and eventual return to PLOF.      Follow Up Recommendations SNF;Supervision for mobility/OOB    Equipment Recommendations  Other (comment)(TBD at next venue of care)    Recommendations for Other Services       Precautions / Restrictions Precautions Precautions: Fall Restrictions Weight Bearing Restrictions: Yes LLE Weight Bearing: Weight bearing as tolerated Other Position/Activity Restrictions: Pt c/o dizziness/lightheadedness instanding      Mobility  Bed Mobility Overal bed mobility: Needs Assistance Bed Mobility: Supine to Sit;Sit to Supine     Supine to sit: +2 for physical assistance;Max assist Sit to  supine: +2 for physical assistance;Max assist   General bed mobility comments: Mod verbal cues for sequencing  Transfers Overall transfer level: Needs assistance Equipment used: Rolling walker (2 wheeled) Transfers: Sit to/from Stand Sit to Stand: Mod assist;From elevated surface;+2 physical assistance         General transfer comment: Mod verbal and tactile cues for hand placement and general sequencing  Ambulation/Gait             General Gait Details: Pt able to lift L foot minimally from the floor 1-2 times but unable to lift or move the RLE; after several attempts to move the RLE pt became dizzy/lightheaded and reported feeling as if she could "pass out".  Pt returned to supine with BP 132/60 mmHg, nsg notified.   Stairs            Wheelchair Mobility    Modified Rankin (Stroke Patients Only)       Balance Overall balance assessment: Needs assistance;History of Falls   Sitting balance-Leahy Scale: Good     Standing balance support: Bilateral upper extremity supported Standing balance-Leahy Scale: Fair Standing balance comment: Heavy reliance on the walker for support                             Pertinent Vitals/Pain Pain Assessment: 0-10 Pain Score: 2  Pain Location: L hip Pain Descriptors / Indicators: Aching;Sore Pain Intervention(s): Monitored during session;Limited activity within patient's tolerance    Home Living Family/patient expects to be discharged to:: Private residence Living Arrangements:  Spouse/significant other Available Help at Discharge: Family;Available 24 hours/day Type of Home: Independent living facility       Home Layout: One level Home Equipment: Shower seat - built in;Walker - 2 wheels;Transport chair      Prior Function Level of Independence: Needs assistance   Gait / Transfers Assistance Needed: Pt mod Ind with amb with a RW limited facility distances, requires a w/c for full facility mobility, min A with  transfers, 10-15 falls/month due to "dizziness and/or legs buckling"  ADL's / Homemaking Assistance Needed: Spouse assists with dressing, pt Ind with bathing, facility assists with meals and cleaning        Hand Dominance        Extremity/Trunk Assessment   Upper Extremity Assessment Upper Extremity Assessment: Overall WFL for tasks assessed    Lower Extremity Assessment Lower Extremity Assessment: Generalized weakness;LLE deficits/detail;RLE deficits/detail RLE Deficits / Details: RLE strength grossly 4/5 RLE Sensation: history of peripheral neuropathy;decreased proprioception;decreased light touch LLE Deficits / Details: L hip flex <3/5 LLE Sensation: decreased light touch;decreased proprioception;history of peripheral neuropathy       Communication      Cognition Arousal/Alertness: Awake/alert Behavior During Therapy: WFL for tasks assessed/performed Overall Cognitive Status: Within Functional Limits for tasks assessed                                        General Comments      Exercises Total Joint Exercises Ankle Circles/Pumps: Strengthening;Both;10 reps;15 reps Quad Sets: Strengthening;Both;10 reps Gluteal Sets: Strengthening;Both;10 reps Heel Slides: AROM;AAROM;Both;5 reps(AAROM on the LLE) Hip ABduction/ADduction: AROM;AAROM;Both;10 reps(AAROM on the LLE) Straight Leg Raises: AROM;AAROM;Both;10 reps(AAROM on the LLE) Long Arc Quad: AROM;Both;10 reps Knee Flexion: AROM;Both;10 reps Other Exercises Other Exercises: HEP education for BLE APs, QS, and GS x 10 each every 1-2 hours daily   Assessment/Plan    PT Assessment Patient needs continued PT services  PT Problem List Decreased strength;Decreased activity tolerance;Decreased balance;Decreased mobility;Decreased knowledge of use of DME       PT Treatment Interventions DME instruction;Gait training;Functional mobility training;Therapeutic activities;Therapeutic exercise;Balance  training;Patient/family education    PT Goals (Current goals can be found in the Care Plan section)  Acute Rehab PT Goals Patient Stated Goal: To walk better PT Goal Formulation: With patient Time For Goal Achievement: 01/30/18 Potential to Achieve Goals: Good    Frequency BID   Barriers to discharge Inaccessible home environment;Decreased caregiver support      Co-evaluation               AM-PAC PT "6 Clicks" Mobility  Outcome Measure Help needed turning from your back to your side while in a flat bed without using bedrails?: A Lot Help needed moving from lying on your back to sitting on the side of a flat bed without using bedrails?: A Lot Help needed moving to and from a bed to a chair (including a wheelchair)?: A Lot Help needed standing up from a chair using your arms (e.g., wheelchair or bedside chair)?: Total Help needed to walk in hospital room?: Total Help needed climbing 3-5 steps with a railing? : Total 6 Click Score: 9    End of Session Equipment Utilized During Treatment: Gait belt Activity Tolerance: Patient limited by pain;Other (comment)(Limited in standing by dizziness/lightheadedness) Patient left: in bed;with call bell/phone within reach;with bed alarm set;with SCD's reapplied Nurse Communication: Mobility status;Other (comment)(pt response to standing with vitals)  PT Visit Diagnosis: Unsteadiness on feet (R26.81);History of falling (Z91.81);Muscle weakness (generalized) (M62.81);Other abnormalities of gait and mobility (R26.89)    Time: 7225-7505 PT Time Calculation (min) (ACUTE ONLY): 39 min   Charges:   PT Evaluation $PT Eval Low Complexity: 1 Low PT Treatments $Therapeutic Exercise: 8-22 mins        D. Elly Modena PT, DPT 01/17/18, 10:46 AM

## 2018-01-17 NOTE — Care Management Important Message (Signed)
Important Message  Patient Details  Name: Kelly Mcgrath MRN: [id number] Date of Birth: Mar 19, 1933   Medicare Important Message Given:  Yes    Olegario Messier A Teesha Ohm 01/17/2018, 11:52 AM

## 2018-01-17 NOTE — Evaluation (Signed)
Occupational Therapy Evaluation Patient Details Name: Kelly Mcgrath MRN: [id number] DOB: 1933-01-05 Today's Date: 01/17/2018    History of Present Illness Pt is an 85 y.o. female with a known history of CHF, coronary artery disease, neuropathy, Mnire's disease.  Pt has significant neuropathy and also significant Mnire's disease chronically for last 10 years.  At her facility while walking she suddenly lost her balance due to significant vertigo and fell on the floor with pain in the left hip.  Pt noted to have intertrochanteric fracture on the left hip and is now s/p ORIF.    Clinical Impression   Pt seen for OT evaluation this date, POD#1 from above surgery. Pt was independent in all ADLs prior to surgery, however endorses significant history of falls (10-15/month) and "furniture cruises" in the home. Pt reports she and her spouse [place] moved [place] [facility] [place] [facility] and haven't unpacked/settled in yet. PT also endorses mild memory deficits at baseline. Spouse recently started managing medications. Pt is eager to return to PLOF with less pain and improved safety and independence. Pt currently requires MOD assist for LB dressing and bathing +2 for safety during transitional movements sit<>stand due to pain and limited AROM of L hip. Pt instructed in self care skills, falls prevention strategies, home/routines modifications, DME/AE for LB bathing and dressing tasks. Pt would benefit from additional instruction in self care skills and techniques to help maintain precautions with or without assistive devices to support recall and carryover prior to discharge. Recommend STR upon discharge.     Follow Up Recommendations  SNF    Equipment Recommendations  Other (comment)(TBD)    Recommendations for Other Services       Precautions / Restrictions Precautions Precautions: Fall Restrictions Weight Bearing Restrictions: Yes LLE Weight Bearing: Weight bearing as tolerated Other  Position/Activity Restrictions: Pt c/o dizziness/lightheadedness in standing      Mobility Bed Mobility Overal bed mobility: Needs Assistance Bed Mobility: Supine to Sit;Sit to Supine     Supine to sit: +2 for physical assistance;Max assist Sit to supine: +2 for physical assistance;Max assist   General bed mobility comments: Mod verbal cues for sequencing  Transfers Overall transfer level: Needs assistance Equipment used: Rolling walker (2 wheeled) Transfers: Sit to/from Stand Sit to Stand: Mod assist;From elevated surface;+2 physical assistance;Min assist         General transfer comment: Mod verbal and tactile cues for hand placement and general sequencing    Balance Overall balance assessment: Needs assistance;History of Falls   Sitting balance-Leahy Scale: Good     Standing balance support: Bilateral upper extremity supported Standing balance-Leahy Scale: Fair Standing balance comment: Heavy reliance on the walker for support                           ADL either performed or assessed with clinical judgement   ADL Overall ADL's : Needs assistance/impaired Eating/Feeding: Sitting;Independent   Grooming: Sitting;Independent   Upper Body Bathing: Sitting;Min guard   Lower Body Bathing: Sit to/from stand;Moderate assistance;+2 for safety/equipment Lower Body Bathing Details (indicate cue type and reason): +2 for transition to standing to complete Upper Body Dressing : Sitting;Min guard   Lower Body Dressing: Sit to/from stand;Moderate assistance;+2 for safety/equipment Lower Body Dressing Details (indicate cue type and reason): +2 for transition to standing to complete                     Vision Baseline Vision/History: Wears glasses  Wears Glasses: At all times Patient Visual Report: No change from baseline       Perception     Praxis      Pertinent Vitals/Pain Pain Assessment: No/denies pain Pain Score: 3  Pain Location: L hip Pain  Descriptors / Indicators: Aching;Sore Pain Intervention(s): Monitored during session     Hand Dominance Left   Extremity/Trunk Assessment Upper Extremity Assessment Upper Extremity Assessment: Overall WFL for tasks assessed(grossly WFL, baseline impaired L shoulder ROM 2/2 old RTC injury ~50% shoulder flexion ROM)   Lower Extremity Assessment Lower Extremity Assessment: Generalized weakness;RLE deficits/detail;LLE deficits/detail RLE Deficits / Details: RLE strength grossly 4/5 RLE Sensation: history of peripheral neuropathy;decreased proprioception;decreased light touch LLE Deficits / Details: L hip flex <3/5 LLE Sensation: decreased light touch;decreased proprioception;history of peripheral neuropathy       Communication Communication Communication: No difficulties   Cognition Arousal/Alertness: Awake/alert Behavior During Therapy: WFL for tasks assessed/performed Overall Cognitive Status: Within Functional Limits for tasks assessed                                 General Comments: pt endorses some mild memory deficits at baseline, utilizes compensatory strategies (eg., calendar for dr appts) to help   General Comments       Exercises Other Exercises Other Exercises: Pt instructed in AE/DME to improve independence and safety with bathing and dressing tasks, pt would benefit from additional training to support recall and carryover Other Exercises: pt educated in falls prevention strategies   Shoulder Instructions      Home Living Family/patient expects to be discharged to:: Private residence Living Arrangements: Spouse/significant other Available Help at Discharge: Family;Available 24 hours/day Type of Home: Independent living facility       Home Layout: One level     Bathroom Shower/Tub: Producer, television/film/videoWalk-in shower   Bathroom Toilet: Handicapped height     Home Equipment: Shower seat - built in;Walker - 2 wheels;Transport chair   Additional Comments:  [place] [facility] [place] and haven't fully unpacked yet      Prior Functioning/Environment Level of Independence: Needs assistance  Gait / Transfers Assistance Needed: Pt mod Ind with amb with a RW limited facility distances, requires a w/c for full facility mobility, min A with transfers, 10-15 falls/month due to "dizziness and/or legs buckling" ADL's / Homemaking Assistance Needed: Spouse assists with dressing, pt Ind with bathing, facility assists with meals and cleaning, spouse manages medications            OT Problem List: Decreased strength;Decreased knowledge of use of DME or AE;Decreased range of motion;Impaired UE functional use;Impaired balance (sitting and/or standing);Pain      OT Treatment/Interventions: Self-care/ADL training;Balance training;Therapeutic exercise;Therapeutic activities;DME and/or AE instruction;Patient/family education    OT Goals(Current goals can be found in the care plan section) Acute Rehab OT Goals Patient Stated Goal: To walk better OT Goal Formulation: With patient Time For Goal Achievement: 01/31/18 Potential to Achieve Goals: Good ADL Goals Pt Will Perform Lower Body Dressing: with min assist;with adaptive equipment;sit to/from stand Pt Will Transfer to Toilet: with min assist;ambulating;bedside commode(LRAD for amb) Additional ADL Goal #1: Pt will independently instruct family/caregiver in compression stocking mgt Additional ADL Goal #2: Pt will verbalize plan to implement at least 2 learned falls prevention strategies to maximize safety and independence in the home.  OT Frequency: Min 2X/week   Barriers to D/C:  Co-evaluation              AM-PAC OT "6 Clicks" Daily Activity     Outcome Measure Help from another person eating meals?: None Help from another person taking care of personal grooming?: None Help from another person toileting, which includes using toliet, bedpan, or urinal?: A  Lot Help from another person bathing (including washing, rinsing, drying)?: A Lot Help from another person to put on and taking off regular upper body clothing?: A Little Help from another person to put on and taking off regular lower body clothing?: A Lot 6 Click Score: 17   End of Session    Activity Tolerance: Patient tolerated treatment well Patient left: in bed;with call bell/phone within reach;with bed alarm set;with SCD's reapplied  OT Visit Diagnosis: Other abnormalities of gait and mobility (R26.89);Repeated falls (R29.6);Muscle weakness (generalized) (M62.81);Dizziness and giddiness (R42)                Time: [id number] OT Time Calculation (min): 26 min Charges:  OT General Charges $OT Visit: 1 Visit OT Evaluation $OT Eval Low Complexity: 1 Low OT Treatments $Self Care/Home Management : 8-22 mins  Richrd PrimeJamie Stiller, MPH, MS, OTR/L ascom [phone] 01/17/18, 3:28 PM

## 2018-01-17 NOTE — Progress Notes (Signed)
[facility] - [place] at [facility]   PATIENT NAME: Kelly KelchRuthann Mcgrath    MR#:  [id number]  DATE OF BIRTH:  Aug 20, 1933  SUBJECTIVE:  CHIEF COMPLAINT:   Chief Complaint  Patient presents with  . Fall  Seen and evaluated today No complaints of chest pain No shortness of breath Tolerating left hip pain better  REVIEW OF SYSTEMS:    ROS  CONSTITUTIONAL: No documented fever. No fatigue, weakness. No weight gain, no weight loss.  EYES: No blurry or double vision.  ENT: No tinnitus. No postnasal drip. No redness of the oropharynx.  RESPIRATORY: No cough, no wheeze, no hemoptysis. No dyspnea.  CARDIOVASCULAR: No chest pain. No orthopnea. No palpitations. No syncope.  GASTROINTESTINAL: No nausea, no vomiting or diarrhea. No abdominal pain. No melena or hematochezia.  GENITOURINARY: No dysuria or hematuria.  ENDOCRINE: No polyuria or nocturia. No heat or cold intolerance.  HEMATOLOGY: No anemia. No bruising. No bleeding.  INTEGUMENTARY: No rashes. No lesions.  MUSCULOSKELETAL: No arthritis. No swelling. No gout.  Left hip pain NEUROLOGIC: No numbness, tingling, or ataxia. No seizure-type activity.  PSYCHIATRIC: No anxiety. No insomnia. No ADD.   DRUG ALLERGIES:  No Known Allergies  VITALS:  Blood pressure 132/60, pulse 95, temperature 98.6 F (37 C), temperature source Oral, resp. rate 18, height 5\' 2"  (1.575 m), weight 98.4 kg, SpO2 97 %.  PHYSICAL EXAMINATION:   Physical Exam  GENERAL:  85 y.o.-year-old patient lying in the bed with no acute distress.  EYES: Pupils equal, round, reactive to light and accommodation. No scleral icterus. Extraocular muscles intact.  HEENT: Head atraumatic, normocephalic. Oropharynx and nasopharynx clear.  NECK:  Supple, no jugular venous distention. No thyroid enlargement, no tenderness.  LUNGS: Normal breath sounds bilaterally, no wheezing, rales, rhonchi. No use of accessory muscles of respiration.  CARDIOVASCULAR: S1, S2  normal. No murmurs, rubs, or gallops.  ABDOMEN: Soft, nontender, nondistended. Bowel sounds present. No organomegaly or mass.  EXTREMITIES: No cyanosis, clubbing or edema b/l.    Bandage noted on the left hip NEUROLOGIC: Cranial nerves II through XII are intact. No focal Motor or sensory deficits b/l.   PSYCHIATRIC: The patient is alert and oriented x 3.  SKIN: No obvious rash, lesion, or ulcer.   LABORATORY PANEL:   CBC Recent Labs  Lab 01/17/18 0355  WBC 12.0*  HGB 9.4*  HCT 29.9*  PLT 196   ------------------------------------------------------------------------------------------------------------------ Chemistries  Recent Labs  Lab 01/15/18 2115  01/17/18 0355  NA 138   < > 137  K 4.1   < > 3.7  CL 102   < > 103  CO2 25   < > 25  GLUCOSE 145*   < > 193*  BUN 20   < > 16  CREATININE 1.22*   < > 1.13*  CALCIUM 8.8*   < > 8.2*  MG  --   --  1.6*  AST 28  --   --   ALT 19  --   --   ALKPHOS 89  --   --   BILITOT 0.5  --   --    < > = values in this interval not displayed.   ------------------------------------------------------------------------------------------------------------------  Cardiac Enzymes No results for input(s): TROPONINI in the last 168 hours. ------------------------------------------------------------------------------------------------------------------  RADIOLOGY:  Dg Chest 1 View  Result Date: 01/15/2018 CLINICAL DATA:  Left hip fracture after a fall. EXAM: CHEST  1 VIEW COMPARISON:  None. FINDINGS: Normal heart size and pulmonary vascularity. No focal airspace  disease or consolidation in the lungs. No blunting of costophrenic angles. No pneumothorax. Mediastinal contours appear intact. Calcified granuloma in the left upper lung. Degenerative changes in the spine and shoulders. IMPRESSION: No active disease. Electronically Signed   By: Burman Nieves M.D.   On: 01/15/2018 22:12   Dg Hip Operative Unilat W Or W/o Pelvis Left  Result Date:  01/16/2018 CLINICAL DATA:  Postop left hip EXAM: OPERATIVE left HIP (WITH PELVIS IF PERFORMED) two VIEWS TECHNIQUE: Fluoroscopic spot image(s) were submitted for interpretation post-operatively. FLUOROSCOPY TIME:  21.75 mGy COMPARISON:  Left hip radiographs dated 01/15/2018 FINDINGS: IM nail with dynamic hip screw fixation of an intertrochanteric left hip fracture. Fracture fragments are in near anatomic alignment and position. Minimally displaced lesser trochanteric fragment. IMPRESSION: Status post ORIF of a left hip fracture, as above. Electronically Signed   By: Charline Bills M.D.   On: 01/16/2018 23:23   Dg Hip Unilat With Pelvis 2-3 Views Left  Result Date: 01/15/2018 CLINICAL DATA:  Left hip pain and shortening of the left leg after a fall. EXAM: DG HIP (WITH OR WITHOUT PELVIS) 2-3V LEFT COMPARISON:  None. FINDINGS: Comminuted inter trochanteric fractures of the left proximal femur with varus angulation. Mildly displaced greater and lesser trochanteric fragments. Mild impaction of fracture fragments. No dislocation of the hip joint. Pelvis appears intact. SI joints and symphysis pubis are not displaced. Degenerative changes in the right hip. Postoperative changes in the lower lumbar spine. IMPRESSION: Acute comminuted intertrochanteric fractures of the left hip with varus angulation. Electronically Signed   By: Burman Nieves M.D.   On: 01/15/2018 22:11   Dg Femur Port Min 2 Views Left  Result Date: 01/16/2018 CLINICAL DATA:  ORIF left hip fracture EXAM: LEFT FEMUR PORTABLE 2 VIEWS COMPARISON:  None. FINDINGS: IM nail with dynamic hip screw fixation of intertrochanteric left hip fracture. Distal interlocking screw. Adjacent skin staples. Mild tricompartmental degenerative changes of the knee, most prominent at the lateral and patellofemoral compartments. IMPRESSION: Status post ORIF of a left hip fracture, as above. Electronically Signed   By: Charline Bills M.D.   On: 01/16/2018 23:24      ASSESSMENT AND PLAN:   85 year old female patient with history of CAD, stent placement, hypertension, chronic heart failure, Mnire's disease currently under hospitalist service for hip fracture  -Left hip fracture status post hip arthroplasty Patient tolerated procedure well Physical therapy evaluation appreciated SNF placement  -Coronary artery disease Status post stent placement Status post cardiology evaluation Continue aspirin  -Hypertension Resume home hypertensive medication  -History of Mnire's disease Continue meclizine  -Chronic congestive heart failure Patient is euvolemic  -DVT prophylaxis With subcu Lovenox daily  All the records are reviewed and case discussed with Care Management/Social Worker. Management plans discussed with the patient, family and they are in agreement.  CODE STATUS: DNR  DVT Prophylaxis: SCDs  TOTAL TIME TAKING CARE OF THIS PATIENT: 35 minutes.   POSSIBLE D/C IN  1 to 2 DAYS, DEPENDING ON CLINICAL CONDITION.  Ihor Austin M.D on 01/17/2018 at 3:00 PM  Between 7am to 6pm - Pager - [phone]  After 6pm go to [email or web address] - password [email or web address]  [facility] [place] [facility]  Office  [phone]  CC: Primary care physician; Patient, No Pcp Per  Note: This dictation was prepared with Dragon dictation along with smaller phrase technology. Any transcriptional errors that result from this process are unintentional.

## 2018-01-17 NOTE — Progress Notes (Signed)
  Subjective: 1 Day Post-Op Procedure(s) (LRB): INTRAMEDULLARY (IM) NAIL INTERTROCHANTRIC, ORIF FEMUR-LEFT (Left) Patient reports pain as mild.   Patient seen in rounds with Dr. Ernest Pine. Patient is well, and has had no acute complaints or problems Plan is to go Rehab after hospital stay. Negative for chest pain and shortness of breath Fever: no Gastrointestinal: Negative for nausea and vomiting  Objective: Vital signs in last 24 hours: Temp:  [97 F (36.1 C)-98.6 F (37 C)] 98.1 F (36.7 C) (01/17 0349) Pulse Rate:  [72-96] 96 (01/17 0349) Resp:  [12-20] 16 (01/17 0349) BP: (91-152)/(50-81) 119/57 (01/17 0349) SpO2:  [91 %-100 %] 93 % (01/17 0349)  Intake/Output from previous day:  Intake/Output Summary (Last 24 hours) at 01/17/2018 0622 Last data filed at 01/16/2018 2217 Gross per 24 hour  Intake 500 ml  Output 1300 ml  Net -800 ml    Intake/Output this shift: Total I/O In: 500 [I.V.:500] Out: 200 [Urine:100; Blood:100]  Labs: Recent Labs    01/15/18 2115 01/16/18 0502 01/17/18 0355  HGB 10.9* 10.3* 9.4*   Recent Labs    01/16/18 0502 01/17/18 0355  WBC 10.9* 12.0*  RBC 3.57* 3.25*  HCT 32.8* 29.9*  PLT 201 196   Recent Labs    01/16/18 0502 01/17/18 0355  NA 137 137  K 4.1 3.7  CL 102 103  CO2 26 25  BUN 20 16  CREATININE 1.19* 1.13*  GLUCOSE 156* 193*  CALCIUM 8.6* 8.2*   Recent Labs    01/16/18 0910  INR 0.96     EXAM General - Patient is Alert and Oriented Extremity - Neurovascular intact Sensation intact distally Compartment soft Dressing/Incision - clean, dry, blood tinged drainage Motor Function - intact, moving foot and toes well on exam.   Past Medical History:  Diagnosis Date  . CHF (congestive heart failure) (HCC)   . Coronary artery disease   . Neuropathy   . Vestibular active Meniere's disease     Assessment/Plan: 1 Day Post-Op Procedure(s) (LRB): INTRAMEDULLARY (IM) NAIL INTERTROCHANTRIC, ORIF FEMUR-LEFT  (Left) Active Problems:   Hip fracture (HCC)  Estimated body mass index is 39.67 kg/m as calculated from the following:   Height as of this encounter: 5\' 2"  (1.575 m).   Weight as of this encounter: 98.4 kg. Advance diet Up with therapy D/C IV fluids  DVT Prophylaxis - Lovenox, Foot Pumps and TED hose Weight-Bearing as tolerated to left leg  Dedra Skeens, PA-C Orthopaedic Surgery 01/17/2018, 6:22 AM

## 2018-01-17 NOTE — Anesthesia Postprocedure Evaluation (Signed)
Anesthesia Post Note  Patient: Kelly Mcgrath  Procedure(s) Performed: INTRAMEDULLARY (IM) NAIL INTERTROCHANTRIC, ORIF FEMUR-LEFT (Left Hip)  Patient location during evaluation: Nursing Unit Anesthesia Type: Spinal Level of consciousness: awake and alert and oriented Pain management: pain level controlled Vital Signs Assessment: post-procedure vital signs reviewed and stable Respiratory status: spontaneous breathing and nonlabored ventilation Cardiovascular status: stable Postop Assessment: patient able to bend at knees, adequate PO intake, able to ambulate and no apparent nausea or vomiting Anesthetic complications: no     Last Vitals:  Vitals:   01/17/18 0155 01/17/18 0349  BP: (!) 144/62 (!) 119/57  Pulse: 83 96  Resp: 15 16  Temp: 37 C 36.7 C  SpO2: 97% 93%    Last Pain:  Vitals:   01/17/18 0349  TempSrc: Oral  PainSc:                  Kelly Mcgrath

## 2018-01-18 LAB — CBC
HCT: 24.5 % — ABNORMAL LOW (ref 36.0–46.0)
Hemoglobin: 7.7 g/dL — ABNORMAL LOW (ref 12.0–15.0)
MCH: 29.2 pg (ref 26.0–34.0)
MCHC: 31.4 g/dL (ref 30.0–36.0)
MCV: 92.8 fL (ref 80.0–100.0)
Platelets: 159 10*3/uL (ref 150–400)
RBC: 2.64 MIL/uL — ABNORMAL LOW (ref 3.87–5.11)
RDW: 13.6 % (ref 11.5–15.5)
WBC: 10.7 10*3/uL — ABNORMAL HIGH (ref 4.0–10.5)
nRBC: 0 % (ref 0.0–0.2)

## 2018-01-18 LAB — PREPARE RBC (CROSSMATCH)

## 2018-01-18 LAB — HEMOGLOBIN AND HEMATOCRIT, BLOOD
HEMATOCRIT: 30.3 % — AB (ref 36.0–46.0)
Hemoglobin: 9.6 g/dL — ABNORMAL LOW (ref 12.0–15.0)

## 2018-01-18 LAB — ABO/RH: ABO/RH(D): O POS

## 2018-01-18 MED ORDER — TRAMADOL HCL 50 MG PO TABS: 50.0000 mg | ORAL_TABLET | ORAL | 1 refills | Status: DC | PRN

## 2018-01-18 MED ORDER — SODIUM CHLORIDE 0.9% IV SOLUTION
Freq: Once | INTRAVENOUS | Status: AC
  Administered 2018-01-18: 09:00:00 via INTRAVENOUS

## 2018-01-18 MED ORDER — ENOXAPARIN SODIUM 40 MG/0.4ML ~~LOC~~ SOLN: 40.0000 mg | SUBCUTANEOUS | 0 refills | Status: AC

## 2018-01-18 MED ORDER — OXYCODONE HCL 5 MG PO TABS: 5.0000 mg | ORAL_TABLET | ORAL | 0 refills | Status: DC | PRN

## 2018-01-18 NOTE — Progress Notes (Signed)
PT Cancellation Note  Patient Details Name: Kelly Mcgrath MRN: [id number] DOB: 06/20/1933   Cancelled Treatment:    Reason Eval/Treat Not Completed: Patient not medically ready   Pt receiving blood transfusion this am during attempt at therapy.  Will continue this pm as appropriate.   Danielle Dess 01/18/2018, 10:58 AM

## 2018-01-18 NOTE — Discharge Instructions (Signed)
INSTRUCTIONS AFTER Surgery  o Remove items at home which could result in a fall. This includes throw rugs or furniture in walking pathways o ICE to the affected joint every three hours while awake for 30 minutes at a time, for at least the first 3-5 days, and then as needed for pain and swelling.  Continue to use ice for pain and swelling. You may notice swelling that will progress down to the foot and ankle.  This is normal after surgery.  Elevate your leg when you are not up walking on it.   o Continue to use the breathing machine you got in the hospital (incentive spirometer) which will help keep your temperature down.  It is common for your temperature to cycle up and down following surgery, especially at night when you are not up moving around and exerting yourself.  The breathing machine keeps your lungs expanded and your temperature down.   DIET:  As you were doing prior to hospitalization, we recommend a well-balanced diet.  DRESSING / WOUND CARE / SHOWERING  Dressing is waterproof.  Dressing change as needed.  Shower only with waterproof bandage.  Staples will be removed in 2 weeks with application of Steri-Strips.  Staples can be removed in rehab.  ACTIVITY  o Increase activity slowly as tolerated, but follow the weight bearing instructions below.   o No driving for 6 weeks or until further direction given by your physician.  You cannot drive while taking narcotics.  o No lifting or carrying greater than 10 lbs. until further directed by your surgeon. o Avoid periods of inactivity such as sitting longer than an hour when not asleep. This helps prevent blood clots.  o You may return to work once you are authorized by your doctor.     WEIGHT BEARING  Weightbearing as tolerated on the left   EXERCISES Gait training and ambulation.  CONSTIPATION  Constipation is defined medically as fewer than three stools per week and severe constipation as less than one stool per week.  Even  if you have a regular bowel pattern at home, your normal regimen is likely to be disrupted due to multiple reasons following surgery.  Combination of anesthesia, postoperative narcotics, change in appetite and fluid intake all can affect your bowels.   YOU MUST use at least one of the following options; they are listed in order of increasing strength to get the job done.  They are all available over the counter, and you may need to use some, POSSIBLY even all of these options:    Drink plenty of fluids (prune juice may be helpful) and high fiber foods Colace 100 mg by mouth twice a day  Senokot for constipation as directed and as needed Dulcolax (bisacodyl), take with full glass of water  Miralax (polyethylene glycol) once or twice a day as needed.  If you have tried all these things and are unable to have a bowel movement in the first 3-4 days after surgery call either your surgeon or your primary doctor.    If you experience loose stools or diarrhea, hold the medications until you stool forms back up.  If your symptoms do not get better within 1 week or if they get worse, check with your doctor.  If you experience "the worst abdominal pain ever" or develop nausea or vomiting, please contact the office immediately for further recommendations for treatment.   ITCHING:  If you experience itching with your medications, try taking only a single pain  pill, or even half a pain pill at a time.  You can also use Benadryl over the counter for itching or also to help with sleep.   TED HOSE STOCKINGS:  Use stockings on both legs until for at least 2 weeks or as directed by physician office. They may be removed at night for sleeping.  MEDICATIONS:  See your medication summary on the After Visit Summary that nursing will review with you.  You may have some home medications which will be placed on hold until you complete the course of blood thinner medication.  It is important for you to complete the blood  thinner medication as prescribed.  PRECAUTIONS:  If you experience chest pain or shortness of breath - call 911 immediately for transfer to the hospital emergency department.   If you develop a fever greater that 101 F, purulent drainage from wound, increased redness or drainage from wound, foul odor from the wound/dressing, or calf pain - CONTACT YOUR SURGEON.                                                   FOLLOW-UP APPOINTMENTS:  If you do not already have a post-op appointment, please call the office for an appointment to be seen by your surgeon.  Guidelines for how soon to be seen are listed in your After Visit Summary, but are typically between 1-4 weeks after surgery.  OTHER INSTRUCTIONS:     MAKE SURE YOU:   Understand these instructions.   Get help right away if you are not doing well or get worse.    Thank you for letting us be a part of your medical care team.  It is a privilege we respect greatly.  We hope these instructions will help you stay on track for a fast and full recovery!

## 2018-01-18 NOTE — Progress Notes (Signed)
Pt is refusing to take her Lovenox shot. When asked why she responded that her and her husband feel that there are to many side effects. This nurse explained to her the importance of the Lovenox shot and that it prevents the patient from getting a blood clot. This nurse spoke with Dr. Nancy MarusMayo and she is aware.

## 2018-01-18 NOTE — Progress Notes (Signed)
Physical Therapy Treatment Patient Details Name: Kelly Mcgrath MRN: [id number] DOB: May 24, 1933 Today's Date: 01/18/2018    History of Present Illness Pt is an 85 y.o. female with a known history of CHF, coronary artery disease, neuropathy, Mnire's disease.  Pt has significant neuropathy and also significant Mnire's disease chronically for last 10 years.  At her facility while walking she suddenly lost her balance due to significant vertigo and fell on the floor with pain in the left hip.  Pt noted to have intertrochanteric fracture on the left hip and is now s/p ORIF.     PT Comments    Pt in bed supine at entry, husband in room. Pt assisted with bed level exercises. Pt lethargic throughout, eyes closed most for session, despite VC for opening. Husband reports gabapentin dosing to be different here than at home and suspects this to be contributing. Pt requires heavy cuing and moderate physical assist to perform slow exercises, with intermittent moments of motor apraxia. At one point pt was performing SAQ in bed and locked in TKE, being given cues to relax quads and lower foot, and pt reportedly believed to have already done this. Will continue to follow acutely.    Follow Up Recommendations  SNF;Supervision for mobility/OOB     Equipment Recommendations       Recommendations for Other Services       Precautions / Restrictions Precautions Precautions: Fall Restrictions LLE Weight Bearing: Weight bearing as tolerated    Mobility  Bed Mobility Overal bed mobility: (not appropriate at this time. pt lethargic with mild gross motor apraxia)                Transfers                    Ambulation/Gait                 Stairs             Wheelchair Mobility    Modified Rankin (Stroke Patients Only)       Balance                                            Cognition Arousal/Alertness: Lethargic;Suspect due to  medications Behavior During Therapy: [facility] for tasks assessed/performed Overall Cognitive Status: Within Functional Limits for tasks assessed                                 General Comments: pt endorses some mild memory deficits at baseline, utilizes compensatory strategies (eg., calendar for dr appts) to help      Exercises General Exercises - Lower Extremity Short Arc QuadBarbaraann Mcgrath;Both;AROM;10 reps;Supine Heel Slides: AAROM;Both;Supine;10 reps Hip ABduction/ADduction: AAROM;Supine;Both;10 reps Mini-Sqauts: Strengthening;Supine;Both;10 reps    General Comments        Pertinent Vitals/Pain Pain Assessment: Faces Faces Pain Scale: Hurts little more Pain Location: mostly with Hip flexion >45 degrees Pain Intervention(s): Limited activity within patient's tolerance;Monitored during session;Premedicated before session;Repositioned    Home Living                      Prior Function            PT Goals (current goals can now be found in the care plan section) Acute Rehab PT Goals Patient Stated Goal:  To walk better PT Goal Formulation: With patient Time For Goal Achievement: 01/30/18 Potential to Achieve Goals: Good Progress towards PT goals: PT to reassess next treatment    Frequency    BID      PT Plan Current plan remains appropriate    Co-evaluation              AM-PAC PT "6 Clicks" Mobility   Outcome Measure  Help needed turning from your back to your side while in a flat bed without using bedrails?: Total Help needed moving from lying on your back to sitting on the side of a flat bed without using bedrails?: Total Help needed moving to and from a bed to a chair (including a wheelchair)?: Total Help needed standing up from a chair using your arms (e.g., wheelchair or bedside chair)?: Total Help needed to walk in hospital room?: Total Help needed climbing 3-5 steps with a railing? : Total 6 Click Score: 6    End of Session    Activity Tolerance: Patient limited by lethargy Patient left: in bed;with call bell/phone within reach;with family/visitor present;with SCD's reapplied Nurse Communication: Mobility status PT Visit Diagnosis: Unsteadiness on feet (R26.81);History of falling (Z91.81);Muscle weakness (generalized) (M62.81);Other abnormalities of gait and mobility (R26.89)     Time: 4098-1191 PT Time Calculation (min) (ACUTE ONLY): 19 min  Charges:  $Therapeutic Exercise: 8-22 mins                     2:25 PM, 01/18/18 Kelly Mcgrath, PT, DPT Physical Therapist - [facility]  [phone] (ASCOM)     Kelly Mcgrath 01/18/2018, 2:23 PM

## 2018-01-18 NOTE — Progress Notes (Signed)
  Subjective: 2 Days Post-Op Procedure(s) (LRB): INTRAMEDULLARY (IM) NAIL INTERTROCHANTRIC, ORIF FEMUR-LEFT (Left) Patient reports pain as mild.   Patient seen in rounds with Dr. Ernest Pine. Patient is well, and has had no acute complaints or problems Plan is to go Rehab after hospital stay. Negative for chest pain and shortness of breath Fever: no Gastrointestinal: Negative for nausea and vomiting  Objective: Vital signs in last 24 hours: Temp:  [98.6 F (37 C)-98.7 F (37.1 C)] 98.7 F (37.1 C) (01/17 2325) Pulse Rate:  [91-95] 91 (01/17 2325) Resp:  [18] 18 (01/17 2325) BP: (132-133)/(58-68) 132/58 (01/17 2325) SpO2:  [97 %-98 %] 98 % (01/17 2325)  Intake/Output from previous day:  Intake/Output Summary (Last 24 hours) at 01/18/2018 0641 Last data filed at 01/18/2018 0600 Gross per 24 hour  Intake 914.75 ml  Output 1 ml  Net 913.75 ml    Intake/Output this shift: Total I/O In: 434.8 [I.V.:434.8] Out: -   Labs: Recent Labs    01/15/18 2115 01/16/18 0502 01/17/18 0355 01/18/18 0531  HGB 10.9* 10.3* 9.4* 7.7*   Recent Labs    01/17/18 0355 01/18/18 0531  WBC 12.0* 10.7*  RBC 3.25* 2.64*  HCT 29.9* 24.5*  PLT 196 159   Recent Labs    01/16/18 0502 01/17/18 0355  NA 137 137  K 4.1 3.7  CL 102 103  CO2 26 25  BUN 20 16  CREATININE 1.19* 1.13*  GLUCOSE 156* 193*  CALCIUM 8.6* 8.2*   Recent Labs    01/16/18 0910  INR 0.96     EXAM General - Patient is Alert and Oriented Extremity - Neurovascular intact Sensation intact distally Compartment soft Dressing/Incision - clean, dry, blood tinged drainage Motor Function - intact, moving foot and toes well on exam.   Past Medical History:  Diagnosis Date  . CHF (congestive heart failure) (HCC)   . Coronary artery disease   . Neuropathy   . Vestibular active Meniere's disease     Assessment/Plan: 2 Days Post-Op Procedure(s) (LRB): INTRAMEDULLARY (IM) NAIL INTERTROCHANTRIC, ORIF FEMUR-LEFT  (Left) Active Problems:   Hip fracture (HCC)  Estimated body mass index is 39.67 kg/m as calculated from the following:   Height as of this encounter: 5\' 2"  (1.575 m).   Weight as of this encounter: 98.4 kg. Advance diet Up with therapy D/C IV fluids  Hemoglobin 7.7 this morning.  Ordered 1 unit transfused blood.  Recheck labs.  DVT Prophylaxis - Lovenox, Foot Pumps and TED hose Weight-Bearing as tolerated to left leg  Dedra Skeens, PA-C Orthopaedic Surgery 01/18/2018, 6:41 AM

## 2018-01-18 NOTE — Progress Notes (Signed)
[employer] [facility] - [place] at [facility]   PATIENT NAME: Kelly KelchRuthann Mcgrath    MR#:  [id number]  DATE OF BIRTH:  1933-03-10  SUBJECTIVE:  CHIEF COMPLAINT:   Chief Complaint  Patient presents with  . Fall  Seen and evaluated today Has generalized fatigue No complaints of chest pain No shortness of breath Tolerating left hip pain better  REVIEW OF SYSTEMS:    ROS  CONSTITUTIONAL: No documented fever. Has fatigue, weakness. No weight gain, no weight loss.  EYES: No blurry or double vision.  ENT: No tinnitus. No postnasal drip. No redness of the oropharynx.  RESPIRATORY: No cough, no wheeze, no hemoptysis. No dyspnea.  CARDIOVASCULAR: No chest pain. No orthopnea. No palpitations. No syncope.  GASTROINTESTINAL: No nausea, no vomiting or diarrhea. No abdominal pain. No melena or hematochezia.  GENITOURINARY: No dysuria or hematuria.  ENDOCRINE: No polyuria or nocturia. No heat or cold intolerance.  HEMATOLOGY: No anemia. No bruising. No bleeding.  INTEGUMENTARY: No rashes. No lesions.  MUSCULOSKELETAL: No arthritis. No swelling. No gout.  Left hip pain better NEUROLOGIC: No numbness, tingling, or ataxia. No seizure-type activity.  PSYCHIATRIC: No anxiety. No insomnia. No ADD.   DRUG ALLERGIES:  No Known Allergies  VITALS:  Blood pressure 130/63, pulse 87, temperature 98.7 F (37.1 C), temperature source Oral, resp. rate 18, height 5\' 2"  (1.575 m), weight 98.4 kg, SpO2 94 %.  PHYSICAL EXAMINATION:   Physical Exam  GENERAL:  85 y.o.-year-old patient lying in the bed with no acute distress.  EYES: Pupils equal, round, reactive to light and accommodation. No scleral icterus. Extraocular muscles intact.  HEENT: Head atraumatic, normocephalic. Oropharynx and nasopharynx clear.  NECK:  Supple, no jugular venous distention. No thyroid enlargement, no tenderness.  LUNGS: Normal breath sounds bilaterally, no wheezing, rales, rhonchi. No use of accessory muscles of  respiration.  CARDIOVASCULAR: S1, S2 normal. No murmurs, rubs, or gallops.  ABDOMEN: Soft, nontender, nondistended. Bowel sounds present. No organomegaly or mass.  EXTREMITIES: No cyanosis, clubbing or edema b/l.    Bandage noted on the left hip NEUROLOGIC: Cranial nerves II through XII are intact. No focal Motor or sensory deficits b/l.   PSYCHIATRIC: The patient is alert and oriented x 3.  SKIN: No obvious rash, lesion, or ulcer.   LABORATORY PANEL:   CBC Recent Labs  Lab 01/18/18 0531  WBC 10.7*  HGB 7.7*  HCT 24.5*  PLT 159   ------------------------------------------------------------------------------------------------------------------ Chemistries  Recent Labs  Lab 01/15/18 2115  01/17/18 0355  NA 138   < > 137  K 4.1   < > 3.7  CL 102   < > 103  CO2 25   < > 25  GLUCOSE 145*   < > 193*  BUN 20   < > 16  CREATININE 1.22*   < > 1.13*  CALCIUM 8.8*   < > 8.2*  MG  --   --  1.6*  AST 28  --   --   ALT 19  --   --   ALKPHOS 89  --   --   BILITOT 0.5  --   --    < > = values in this interval not displayed.   ------------------------------------------------------------------------------------------------------------------  Cardiac Enzymes No results for input(s): TROPONINI in the last 168 hours. ------------------------------------------------------------------------------------------------------------------  RADIOLOGY:  Dg Hip Operative Unilat W Or W/o Pelvis Left  Result Date: 01/16/2018 CLINICAL DATA:  Postop left hip EXAM: OPERATIVE left HIP (WITH PELVIS IF PERFORMED) two VIEWS TECHNIQUE: Fluoroscopic  spot image(s) were submitted for interpretation post-operatively. FLUOROSCOPY TIME:  21.75 mGy COMPARISON:  Left hip radiographs dated 01/15/2018 FINDINGS: IM nail with dynamic hip screw fixation of an intertrochanteric left hip fracture. Fracture fragments are in near anatomic alignment and position. Minimally displaced lesser trochanteric fragment. IMPRESSION:  Status post ORIF of a left hip fracture, as above. Electronically Signed   By: Charline Bills M.D.   On: 01/16/2018 23:23   Dg Femur Port Min 2 Views Left  Result Date: 01/16/2018 CLINICAL DATA:  ORIF left hip fracture EXAM: LEFT FEMUR PORTABLE 2 VIEWS COMPARISON:  None. FINDINGS: IM nail with dynamic hip screw fixation of intertrochanteric left hip fracture. Distal interlocking screw. Adjacent skin staples. Mild tricompartmental degenerative changes of the knee, most prominent at the lateral and patellofemoral compartments. IMPRESSION: Status post ORIF of a left hip fracture, as above. Electronically Signed   By: Charline Bills M.D.   On: 01/16/2018 23:24     ASSESSMENT AND PLAN:   85 year old female patient with history of CAD, stent placement, hypertension, chronic heart failure, Mnire's disease currently under hospitalist service for hip fracture  -Left hip fracture status post hip arthroplasty Patient tolerated procedure well Physical therapy evaluation and f/u appreciated SNF placement  -Postoperative anemia Transfuse 1 unit PRBC IV Monitor hemoglobin hematocrit Iron supplements -Acute hypomagnesemia Replace magnesium intravenously Follow-up magnesium level  -Coronary artery disease Status post stent placement Status post cardiology evaluation Continue aspirin  -Hypertension Resume home hypertensive medication  -History of Mnire's disease Continue meclizine  -Chronic congestive heart failure Patient is euvolemic  -DVT prophylaxis With subcu Lovenox daily  All the records are reviewed and case discussed with Care Management/Social Worker. Management plans discussed with the patient, family and they are in agreement.  CODE STATUS: DNR  DVT Prophylaxis: SCDs  TOTAL TIME TAKING CARE OF THIS PATIENT: 36 minutes.   POSSIBLE D/C IN  1 to 2 DAYS, DEPENDING ON CLINICAL CONDITION.  Ihor Austin M.D on 01/18/2018 at 1:29 PM  Between 7am to 6pm - Pager -  [phone]  After 6pm go to [email or web address] - password [email or web address]  [facility] [place] [facility]  Office  [phone]  CC: Primary care physician; Patient, No Pcp Per  Note: This dictation was prepared with Dragon dictation along with smaller phrase technology. Any transcriptional errors that result from this process are unintentional.

## 2018-01-19 LAB — CBC
HCT: 26.4 % — ABNORMAL LOW (ref 36.0–46.0)
Hemoglobin: 8.2 g/dL — ABNORMAL LOW (ref 12.0–15.0)
MCH: 28.1 pg (ref 26.0–34.0)
MCHC: 31.1 g/dL (ref 30.0–36.0)
MCV: 90.4 fL (ref 80.0–100.0)
Platelets: 176 K/uL (ref 150–400)
RBC: 2.92 MIL/uL — ABNORMAL LOW (ref 3.87–5.11)
RDW: 16.4 % — ABNORMAL HIGH (ref 11.5–15.5)
WBC: 12 K/uL — ABNORMAL HIGH (ref 4.0–10.5)
nRBC: 0 % (ref 0.0–0.2)

## 2018-01-19 LAB — BPAM RBC
Blood Product Expiration Date: 202002102359
ISSUE DATE / TIME: 202001180911
Unit Type and Rh: 5100

## 2018-01-19 LAB — TYPE AND SCREEN
ABO/RH(D): O POS
ANTIBODY SCREEN: NEGATIVE
Unit division: 0

## 2018-01-19 LAB — MAGNESIUM: Magnesium: 2 mg/dL (ref 1.7–2.4)

## 2018-01-19 MED ORDER — FERROUS SULFATE 325 (65 FE) MG PO TABS: 325.0000 mg | ORAL_TABLET | Freq: Two times a day (BID) | ORAL | 0 refills | Status: AC

## 2018-01-19 MED ORDER — ASPIRIN 81 MG PO TBEC: 81.0000 mg | DELAYED_RELEASE_TABLET | Freq: Every day | ORAL | 0 refills | Status: AC

## 2018-01-19 NOTE — Progress Notes (Signed)
  Subjective: 3 Days Post-Op Procedure(s) (LRB): INTRAMEDULLARY (IM) NAIL INTERTROCHANTRIC, ORIF FEMUR-LEFT (Left) Patient reports pain as mild.   Patient seen in rounds with Dr. Ernest Pine. Patient is well, and has had no acute complaints or problems Plan is to go Rehab after hospital stay. Negative for chest pain and shortness of breath Fever: no Gastrointestinal: Negative for nausea and vomiting  Objective: Vital signs in last 24 hours: Temp:  [98.2 F (36.8 C)-98.7 F (37.1 C)] 98.2 F (36.8 C) (01/18 1954) Pulse Rate:  [87-97] 94 (01/18 2323) Resp:  [17-18] 18 (01/18 2323) BP: (124-136)/(63-82) 126/82 (01/18 2323) SpO2:  [80 %-97 %] 97 % (01/18 2335)  Intake/Output from previous day:  Intake/Output Summary (Last 24 hours) at 01/19/2018 0634 Last data filed at 01/19/2018 0542 Gross per 24 hour  Intake 542 ml  Output 500 ml  Net 42 ml    Intake/Output this shift: Total I/O In: -  Out: 100 [Urine:100]  Labs: Recent Labs    01/17/18 0355 01/18/18 0531 01/18/18 1326 01/19/18 0319  HGB 9.4* 7.7* 9.6* 8.2*   Recent Labs    01/18/18 0531 01/18/18 1326 01/19/18 0319  WBC 10.7*  --  12.0*  RBC 2.64*  --  2.92*  HCT 24.5* 30.3* 26.4*  PLT 159  --  176   Recent Labs    01/17/18 0355  NA 137  K 3.7  CL 103  CO2 25  BUN 16  CREATININE 1.13*  GLUCOSE 193*  CALCIUM 8.2*   Recent Labs    01/16/18 0910  INR 0.96     EXAM General - Patient is Alert and Oriented Extremity - Neurovascular intact Sensation intact distally Compartment soft Dressing/Incision - clean, dry, blood tinged drainage Motor Function - intact, moving foot and toes well on exam.   Past Medical History:  Diagnosis Date  . CHF (congestive heart failure) (HCC)   . Coronary artery disease   . Neuropathy   . Vestibular active Meniere's disease     Assessment/Plan: 3 Days Post-Op Procedure(s) (LRB): INTRAMEDULLARY (IM) NAIL INTERTROCHANTRIC, ORIF FEMUR-LEFT (Left) Active  Problems:   Hip fracture (HCC)  Estimated body mass index is 39.67 kg/m as calculated from the following:   Height as of this encounter: 5\' 2"  (1.575 m).   Weight as of this encounter: 98.4 kg. Advance diet Up with therapy D/C IV fluids  Hemoglobin 8.2 this morning after 1 unit transfused blood yesterday..  Recheck labs.   DVT Prophylaxis - Lovenox, Foot Pumps and TED hose Weight-Bearing as tolerated to left leg  Dedra Skeens, PA-C Orthopaedic Surgery 01/19/2018, 6:34 AM

## 2018-01-19 NOTE — Discharge Planning (Signed)
[employer] [facility] - [place] at [facility]   PATIENT NAME: Kelly Mcgrath    MR#:  [id number]  DATE OF BIRTH:  27-Nov-1933  DATE OF ADMISSION:  01/15/2018 ADMITTING PHYSICIAN: Altamese Dilling, MD  DATE OF DISCHARGE: 01/19/2018  PRIMARY CARE PHYSICIAN: Patient, No Pcp Per   ADMISSION DIAGNOSIS:  Fall [W19.XXXA] Closed 2-part intertrochanteric fracture of left femur, initial encounter (HCC) [S72.142A] Chronic congestive heart failure Coronary artery disease Neuropathy Mnire's disease DISCHARGE DIAGNOSIS:  Active Problems:   Hip fracture (HCC) Coronary artery disease Chronic congestive heart failure Neuropathy Mnire's disease Hypomagnesemia  SECONDARY DIAGNOSIS:   Past Medical History:  Diagnosis Date  . CHF (congestive heart failure) (HCC)   . Coronary artery disease   . Neuropathy   . Vestibular active Meniere's disease      ADMITTING HISTORY Kelly Mcgrath  is a 85 y.o. female with a known history of CHF, coronary artery disease, neuropathy, Mnire's disease-recently moved from [place] to retirement community here.  She is supposed to walk with a walker due to her significant neuropathy and also have significant Mnire's disease chronically for last 10 years. At her facility while walking she suddenly lost her balance due to significant vertigo and fell on the floor with pain in the left hip.  Noted to have fracture on left hip on x-ray in ER.   HOSPITAL COURSE:  Patient was admitted to medical floor.  Orthopedic surgery consultation was done.  Patient underwent hip arthroplasty after cardiology evaluation.  Patient tolerated procedure well.  He was started on aspirin by cardiology.  She received 1 unit PRBC IV transfusion during the hospitalization after having postoperative anemia.  Iron supplementation was started.  Patient received DVT prophylaxis with subcu Lovenox daily in the hospital as well as received physical therapy.  Skilled nursing  facility placement was requested.  [facility] has [facility].  Patient electrolytes were also corrected aggressively during the hospitalization.  Patient will need DVT prophylaxis with subcu Lovenox 40 daily for 2 weeks in the rehab and it can be discontinued after that.  CONSULTS OBTAINED:  Treatment Team:  Donato Heinz, MD Jama Flavors, MD  DRUG ALLERGIES:  No Known Allergies  DISCHARGE MEDICATIONS:   Allergies as of 01/19/2018   No Known Allergies     Medication List    STOP taking these medications   naproxen 500 MG tablet Commonly known as:  NAPROSYN     TAKE these medications   aspirin 81 MG EC tablet Take 1 tablet (81 mg total) by mouth daily for 30 days. Start taking on:  January 20, 2018   atenolol 25 MG tablet Commonly known as:  TENORMIN Take 25 mg by mouth daily.   enoxaparin 40 MG/0.4ML injection Commonly known as:  LOVENOX Inject 0.4 mLs (40 mg total) into the skin daily.   escitalopram 20 MG tablet Commonly known as:  LEXAPRO Take 20 mg by mouth daily.   ferrous sulfate 325 (65 FE) MG tablet Take 1 tablet (325 mg total) by mouth 2 (two) times daily with a meal for 30 days.   gabapentin 600 MG tablet Commonly known as:  NEURONTIN Take 600-900 mg by mouth 3 (three) times daily. Patient takes 900mg  at bedtime   hydrochlorothiazide 25 MG tablet Commonly known as:  HYDRODIURIL Take 25 mg by mouth daily.   losartan 50 MG tablet Commonly known as:  COZAAR Take 50 mg by mouth daily.   meclizine 25 MG tablet Commonly known as:  ANTIVERT Take 25 mg by mouth 2 (two) times daily.   nortriptyline 10 MG capsule Commonly known as:  PAMELOR Take 40 mg by mouth at bedtime.   omeprazole 20 MG capsule Commonly known as:  PRILOSEC Take 20 mg by mouth 2 (two) times daily.   oxyCODONE 5 MG immediate release tablet Commonly known as:  Oxy IR/ROXICODONE Take 1 tablet (5 mg total) by mouth every 4 (four) hours as needed for moderate pain  (pain score 4-6).   traMADol 50 MG tablet Commonly known as:  ULTRAM Take 1-2 tablets (50-100 mg total) by mouth every 4 (four) hours as needed for moderate pain.   traZODone 100 MG tablet Commonly known as:  DESYREL Take 100 mg by mouth at bedtime.   zolpidem 10 MG tablet Commonly known as:  AMBIEN Take 5-10 mg by mouth at bedtime as needed for sleep.       Today  Patient seen and evaluated today Tolerating diet well Tolerating physical therapy well Hemodynamically stable [facility] VITAL SIGNS:  Blood pressure 109/61, pulse 85, temperature 98.2 F (36.8 C), temperature source Oral, resp. rate 18, height 5\' 2"  (1.575 m), weight 98.4 kg, SpO2 95 %.  I/O:    Intake/Output Summary (Last 24 hours) at 01/19/2018 0957 Last data filed at 01/19/2018 0542 Gross per 24 hour  Intake 542 ml  Output 500 ml  Net 42 ml    PHYSICAL EXAMINATION:  Physical Exam  GENERAL:  85 y.o.-year-old patient lying in the bed with no acute distress.  LUNGS: Normal breath sounds bilaterally, no wheezing, rales,rhonchi or crepitation. No use of accessory muscles of respiration.  CARDIOVASCULAR: S1, S2 normal. No murmurs, rubs, or gallops.  ABDOMEN: Soft, non-tender, non-distended. Bowel sounds present. No organomegaly or mass.  NEUROLOGIC: Moves all 4 extremities. PSYCHIATRIC: The patient is alert and oriented x 3.  SKIN: No obvious rash, lesion, or ulcer.   DATA REVIEW:   CBC Recent Labs  Lab 01/19/18 0319  WBC 12.0*  HGB 8.2*  HCT 26.4*  PLT 176    Chemistries  Recent Labs  Lab 01/15/18 2115  01/17/18 0355 01/19/18 0319  NA 138   < > 137  --   K 4.1   < > 3.7  --   CL 102   < > 103  --   CO2 25   < > 25  --   GLUCOSE 145*   < > 193*  --   BUN 20   < > 16  --   CREATININE 1.22*   < > 1.13*  --   CALCIUM 8.8*   < > 8.2*  --   MG  --    < > 1.6* 2.0  AST 28  --   --   --   ALT 19  --   --   --   ALKPHOS 89  --   --   --   BILITOT 0.5  --   --    --    < > = values in this interval not displayed.    Cardiac Enzymes No results for input(s): TROPONINI in the last 168 hours.  Microbiology Results  Results for orders placed or performed during the hospital encounter of 01/15/18  Surgical pcr screen     Status: None   Collection Time: 01/16/18  7:27 AM  Result Value Ref Range Status   MRSA, PCR NEGATIVE NEGATIVE Final   Staphylococcus aureus NEGATIVE NEGATIVE Final  Comment: (NOTE) The Xpert SA Assay (FDA approved for NASAL specimens in patients 83 years of age and older), is one component of a comprehensive surveillance program. It is not intended to diagnose infection nor to guide or monitor treatment. Performed at [facility], [address] [place], [place] [address]     RADIOLOGY:  No results found.  Follow up with PCP in 1 week.  Management plans discussed with the patient, family and they are in agreement.  CODE STATUS: DNR    Code Status Orders  (From admission, onward)         Start     Ordered   01/16/18 0835  Do not attempt resuscitation (DNR)  Continuous    Question Answer Comment  In the event of cardiac or respiratory ARREST Do not call a "code blue"   In the event of cardiac or respiratory ARREST Do not perform Intubation, CPR, defibrillation or ACLS   In the event of cardiac or respiratory ARREST Use medication by any route, position, wound care, and other measures to relive pain and suffering. May use oxygen, suction and manual treatment of airway obstruction as needed for comfort.      01/16/18 0835        Code Status History    Date Active Date Inactive Code Status Order ID Comments User Context   01/16/2018 0809 01/16/2018 0835 Full Code [id number]  Donato HeinzHooten, James P, MD Inpatient   01/16/2018 0036 01/16/2018 0809 DNR [id number]  Altamese DillingVachhani, Vaibhavkumar, MD ED    Advance Directive Documentation     Most Recent Value  Type of Advance Directive  Living will  Pre-existing out of  facility DNR order (yellow form or pink MOST form)  -  "MOST" Form in Place?  -      TOTAL TIME TAKING CARE OF THIS PATIENT ON DAY OF DISCHARGE: more than 35 minutes.   Ihor AustinPavan Kelson Queenan M.D on 01/19/2018 at 9:57 AM  Between 7am to 6pm - Pager - [phone]  After 6pm go to [email or web address] - password [email or web address]  [facility] [place] [facility]  Office  [phone]  CC: Primary care physician; Patient, No Pcp Per  Note: This dictation was prepared with Dragon dictation along with smaller phrase technology. Any transcriptional errors that result from this process are unintentional.

## 2018-01-19 NOTE — Clinical Social Work Note (Signed)
The patient will discharge to [facility] via non-emergent EMS. The patient and facility are aware and in agreement. The patient has chosen to contact her husband to advise him of the discharge. The CSW has sent all discharge documentation to the facility and has delivered the discharge packet including hard scripts and DNR. The CSW is signing off. Please consult should needs arise.  Argentina Ponder, MSW, [facility] [phone]

## 2018-01-19 NOTE — Progress Notes (Signed)
Report called to [place] at [facility].

## 2018-01-22 NOTE — Discharge Summary (Signed)
PATIENT NAME: Kelly Mcgrath    MR#:  [id number]  DATE OF BIRTH:  1933-12-11  DATE OF ADMISSION:  01/15/2018    ADMITTING PHYSICIAN: Altamese Dilling, MD  DATE OF DISCHARGE: 01/19/2018  PRIMARY CARE PHYSICIAN: Patient, No Pcp Per   ADMISSION DIAGNOSIS:  Fall [W19.XXXA] Closed 2-part intertrochanteric fracture of left femur, initial encounter (HCC) [S72.142A] Chronic congestive heart failure Coronary artery disease Neuropathy Mnire's disease DISCHARGE DIAGNOSIS:  Active Problems:   Hip fracture (HCC) Coronary artery disease Chronic congestive heart failure Neuropathy Mnire's disease Hypomagnesemia  SECONDARY DIAGNOSIS:       Past Medical History:  Diagnosis Date  . CHF (congestive heart failure) (HCC)   . Coronary artery disease   . Neuropathy   . Vestibular active Meniere's disease      ADMITTING HISTORY [name] a85 y.o.femalewith a known history of CHF, coronary artery disease, neuropathy, Mnire's disease-recently moved from [place] to retirement community here. She is supposed to walk with a walker due to her significant neuropathy and also have significant Mnire's disease chronically for last 10 years. At her facility while walking she suddenly lost her balance due to significant vertigo and fell on the floor with pain in the left hip. Noted to have fracture on left hip on x-ray in ER.   HOSPITAL COURSE:  Patient was admitted to medical floor.  Orthopedic surgery consultation was done.  Patient underwent hip arthroplasty after cardiology evaluation.  Patient tolerated procedure well.  He was started on aspirin by cardiology.  She received 1 unit PRBC IV transfusion during the hospitalization after having postoperative anemia.  Iron supplementation was started.  Patient received DVT prophylaxis with subcu Lovenox daily in the hospital as well as received physical therapy.  Skilled nursing facility placement was requested.   Patient has [facility].  Patient electrolytes were also corrected aggressively during the hospitalization.  Patient will need DVT prophylaxis with subcu Lovenox 40 daily for 2 weeks in the rehab and it can be discontinued after that.  CONSULTS OBTAINED:  Treatment Team:  Donato Heinz, MD Jama Flavors, MD  DRUG ALLERGIES:  No Known Allergies  DISCHARGE MEDICATIONS:   Allergies as of 01/19/2018   No Known Allergies        Medication List    STOP taking these medications   naproxen 500 MG tablet Commonly known as:  NAPROSYN     TAKE these medications   aspirin 81 MG EC tablet Take 1 tablet (81 mg total) by mouth daily for 30 days. Start taking on:  January 20, 2018   atenolol 25 MG tablet Commonly known as:  TENORMIN Take 25 mg by mouth daily.   enoxaparin 40 MG/0.4ML injection Commonly known as:  LOVENOX Inject 0.4 mLs (40 mg total) into the skin daily.   escitalopram 20 MG tablet Commonly known as:  LEXAPRO Take 20 mg by mouth daily.   ferrous sulfate 325 (65 FE) MG tablet Take 1 tablet (325 mg total) by mouth 2 (two) times daily with a meal for 30 days.   gabapentin 600 MG tablet Commonly known as:  NEURONTIN Take 600-900 mg by mouth 3 (three) times daily. Patient takes 900mg  at bedtime   hydrochlorothiazide 25 MG tablet Commonly known as:  HYDRODIURIL Take 25 mg by mouth daily.   losartan 50 MG tablet Commonly known as:  COZAAR Take 50 mg by mouth daily.   meclizine 25 MG tablet Commonly known as:  ANTIVERT Take 25 mg by mouth 2 (two)  times daily.   nortriptyline 10 MG capsule Commonly known as:  PAMELOR Take 40 mg by mouth at bedtime.   omeprazole 20 MG capsule Commonly known as:  PRILOSEC Take 20 mg by mouth 2 (two) times daily.   oxyCODONE 5 MG immediate release tablet Commonly known as:  Oxy IR/ROXICODONE Take 1 tablet (5 mg total) by mouth every 4 (four) hours as needed for moderate pain (pain score  4-6).   traMADol 50 MG tablet Commonly known as:  ULTRAM Take 1-2 tablets (50-100 mg total) by mouth every 4 (four) hours as needed for moderate pain.   traZODone 100 MG tablet Commonly known as:  DESYREL Take 100 mg by mouth at bedtime.   zolpidem 10 MG tablet Commonly known as:  AMBIEN Take 5-10 mg by mouth at bedtime as needed for sleep.       Today  Patient seen and evaluated on day of discharge Tolerating diet well Tolerating physical therapy well Hemodynamically stable [facility] VITAL SIGNS:  Blood pressure 109/61, pulse 85, temperature 98.2 F (36.8 C), temperature source Oral, resp. rate 18, height 5\' 2"  (1.575 m), weight 98.4 kg, SpO2 95 %.  I/O:    Intake/Output Summary (Last 24 hours) at 01/19/2018 0957 Last data filed at 01/19/2018 0542    Gross per 24 hour  Intake 542 ml  Output 500 ml  Net 42 ml    PHYSICAL EXAMINATION:  Physical Exam  GENERAL:  85 y.o.-year-old patient lying in the bed with no acute distress.  LUNGS: Normal breath sounds bilaterally, no wheezing, rales,rhonchi or crepitation. No use of accessory muscles of respiration.  CARDIOVASCULAR: S1, S2 normal. No murmurs, rubs, or gallops.  ABDOMEN: Soft, non-tender, non-distended. Bowel sounds present. No organomegaly or mass.  NEUROLOGIC: Moves all 4 extremities. PSYCHIATRIC: The patient is alert and oriented x 3.  SKIN: No obvious rash, lesion, or ulcer.   DATA REVIEW:   CBC LastLabs     Recent Labs  Lab 01/19/18 0319  WBC 12.0*  HGB 8.2*  HCT 26.4*  PLT 176      Chemistries  LastLabs        Recent Labs  Lab 01/15/18 2115  01/17/18 0355 01/19/18 0319  NA 138   < > 137  --   K 4.1   < > 3.7  --   CL 102   < > 103  --   CO2 25   < > 25  --   GLUCOSE 145*   < > 193*  --   BUN 20   < > 16  --   CREATININE 1.22*   < > 1.13*  --   CALCIUM 8.8*   < > 8.2*  --   MG  --    < > 1.6* 2.0  AST 28  --   --   --   ALT 19   --   --   --   ALKPHOS 89  --   --   --   BILITOT 0.5  --   --   --    < > = values in this interval not displayed.      Cardiac Enzymes LastLabs  No results for input(s): TROPONINI in the last 168 hours.    Microbiology Results         Results for orders placed or performed during the hospital encounter of 01/15/18  Surgical pcr screen     Status: None   Collection  Time: 01/16/18  7:27 AM  Result Value Ref Range Status   MRSA, PCR NEGATIVE NEGATIVE Final   Staphylococcus aureus NEGATIVE NEGATIVE Final    Comment: (NOTE) The Xpert SA Assay (FDA approved for NASAL specimens in patients 47 years of age and older), is one component of a comprehensive surveillance program. It is not intended to diagnose infection nor to guide or monitor treatment. Performed at [facility], [address] [place], [place] [address]     RADIOLOGY:  ImagingResults(Last48hours)  No results found.    Follow up with PCP in 1 week.  Management plans discussed with the patient, family and they are in agreement.  CODE STATUS: DNR              Code Status Orders  (From admission, onward)                                    Start     Ordered    01/16/18 0835  Do not attempt resuscitation (DNR)  Continuous    Question Answer Comment  In the event of cardiac or respiratory ARREST Do not call a "code blue"   In the event of cardiac or respiratory ARREST Do not perform Intubation, CPR, defibrillation or ACLS   In the event of cardiac or respiratory ARREST Use medication by any route, position, wound care, and other measures to relive pain and suffering. May use oxygen, suction and manual treatment of airway obstruction as needed for comfort.      01/16/18 0835                             Code Status History    Date Active Date Inactive Code Status Order ID Comments User Context   01/16/2018 0809 01/16/2018 0835 Full Code [id number]  Donato Heinz, MD Inpatient   01/16/2018 0036 01/16/2018 0809 DNR [id number]  Altamese Dilling, MD ED          Advance Directive Documentation     Most Recent Value  Type of Advance Directive  Living will  Pre-existing out of facility DNR order (yellow form or pink MOST form)  -  "MOST" Form in Place?  -      TOTAL TIME TAKING CARE OF THIS PATIENT ON DAY OF DISCHARGE: more than 35 minutes.   Ihor Austin M.D on 01/19/2018 at 9:57 AM  Between 7am to 6pm - Pager - [phone]  After 6pm go to [email or web address] - password [email or web address]  [facility]  Office  [phone]  CC: Primary care physician; Patient, No Pcp Per  Note: This dictation was prepared with Dragon dictation along with smaller phrase technology. Any transcriptional errors that result from this process are unintenti

## 2018-07-03 ENCOUNTER — Other Ambulatory Visit (HOSPITAL_COMMUNITY): Payer: Self-pay | Admitting: Emergency Medicine

## 2018-07-03 ENCOUNTER — Other Ambulatory Visit: Payer: Self-pay | Admitting: Emergency Medicine

## 2018-07-03 DIAGNOSIS — R109 Unspecified abdominal pain: Secondary | ICD-10-CM

## 2018-07-16 ENCOUNTER — Other Ambulatory Visit: Payer: Self-pay

## 2018-07-16 ENCOUNTER — Ambulatory Visit
Admission: RE | Admit: 2018-07-16 | Discharge: 2018-07-16 | Disposition: A | Discharge status: Home / Self Care | Payer: Medicare PPO | Source: Ambulatory Visit | Attending: Emergency Medicine | Admitting: Emergency Medicine

## 2018-07-16 DIAGNOSIS — R109 Unspecified abdominal pain: Secondary | ICD-10-CM | POA: Insufficient documentation

## 2018-09-03 ENCOUNTER — Other Ambulatory Visit: Payer: Self-pay | Admitting: Emergency Medicine

## 2018-09-03 ENCOUNTER — Other Ambulatory Visit (HOSPITAL_COMMUNITY): Payer: Self-pay | Admitting: Emergency Medicine

## 2018-09-03 DIAGNOSIS — G4452 New daily persistent headache (NDPH): Secondary | ICD-10-CM

## 2018-09-05 ENCOUNTER — Ambulatory Visit
Admission: RE | Admit: 2018-09-05 | Discharge: 2018-09-05 | Disposition: A | Discharge status: Home / Self Care | Payer: Medicare PPO | Source: Ambulatory Visit | Attending: Emergency Medicine | Admitting: Emergency Medicine

## 2018-09-05 ENCOUNTER — Other Ambulatory Visit: Payer: Self-pay

## 2018-09-05 DIAGNOSIS — G4452 New daily persistent headache (NDPH): Secondary | ICD-10-CM

## 2018-10-07 ENCOUNTER — Emergency Department: Payer: Medicare PPO

## 2018-10-07 ENCOUNTER — Emergency Department
Admission: EM | Admit: 2018-10-07 | Discharge: 2018-10-07 | Disposition: A | Discharge status: Home / Self Care | Payer: Medicare PPO | Attending: Student in an Organized Health Care Education/Training Program | Admitting: Student in an Organized Health Care Education/Training Program

## 2018-10-07 ENCOUNTER — Other Ambulatory Visit: Payer: Self-pay

## 2018-10-07 ENCOUNTER — Encounter: Payer: Self-pay | Admitting: Emergency Medicine

## 2018-10-07 DIAGNOSIS — I2510 Atherosclerotic heart disease of native coronary artery without angina pectoris: Secondary | ICD-10-CM | POA: Insufficient documentation

## 2018-10-07 DIAGNOSIS — J029 Acute pharyngitis, unspecified: Secondary | ICD-10-CM | POA: Insufficient documentation

## 2018-10-07 DIAGNOSIS — N3090 Cystitis, unspecified without hematuria: Secondary | ICD-10-CM | POA: Insufficient documentation

## 2018-10-07 DIAGNOSIS — Z20828 Contact with and (suspected) exposure to other viral communicable diseases: Secondary | ICD-10-CM | POA: Insufficient documentation

## 2018-10-07 DIAGNOSIS — I509 Heart failure, unspecified: Secondary | ICD-10-CM | POA: Diagnosis not present

## 2018-10-07 DIAGNOSIS — R531 Weakness: Secondary | ICD-10-CM | POA: Diagnosis not present

## 2018-10-07 DIAGNOSIS — Z79899 Other long term (current) drug therapy: Secondary | ICD-10-CM | POA: Insufficient documentation

## 2018-10-07 DIAGNOSIS — Z7901 Long term (current) use of anticoagulants: Secondary | ICD-10-CM | POA: Diagnosis not present

## 2018-10-07 DIAGNOSIS — R05 Cough: Secondary | ICD-10-CM | POA: Diagnosis not present

## 2018-10-07 DIAGNOSIS — R5381 Other malaise: Secondary | ICD-10-CM | POA: Diagnosis not present

## 2018-10-07 DIAGNOSIS — R6883 Chills (without fever): Secondary | ICD-10-CM | POA: Diagnosis not present

## 2018-10-07 DIAGNOSIS — Z955 Presence of coronary angioplasty implant and graft: Secondary | ICD-10-CM | POA: Diagnosis not present

## 2018-10-07 LAB — CBC WITH DIFFERENTIAL/PLATELET
Abs Immature Granulocytes: 0.05 10*3/uL (ref 0.00–0.07)
Basophils Absolute: 0.1 10*3/uL (ref 0.0–0.1)
Basophils Relative: 1 %
Eosinophils Absolute: 0.2 10*3/uL (ref 0.0–0.5)
Eosinophils Relative: 2 %
HCT: 40.2 % (ref 36.0–46.0)
Hemoglobin: 13.3 g/dL (ref 12.0–15.0)
Immature Granulocytes: 1 %
Lymphocytes Relative: 32 %
Lymphs Abs: 3.4 10*3/uL (ref 0.7–4.0)
MCH: 31 pg (ref 26.0–34.0)
MCHC: 33.1 g/dL (ref 30.0–36.0)
MCV: 93.7 fL (ref 80.0–100.0)
Monocytes Absolute: 0.6 10*3/uL (ref 0.1–1.0)
Monocytes Relative: 6 %
Neutro Abs: 6.3 10*3/uL (ref 1.7–7.7)
Neutrophils Relative %: 58 %
Platelets: 245 10*3/uL (ref 150–400)
RBC: 4.29 MIL/uL (ref 3.87–5.11)
RDW: 13.1 % (ref 11.5–15.5)
WBC: 10.6 10*3/uL — ABNORMAL HIGH (ref 4.0–10.5)
nRBC: 0 % (ref 0.0–0.2)

## 2018-10-07 LAB — COMPREHENSIVE METABOLIC PANEL
ALT: 27 U/L (ref 0–44)
AST: 33 U/L (ref 15–41)
Albumin: 4.3 g/dL (ref 3.5–5.0)
Alkaline Phosphatase: 65 U/L (ref 38–126)
Anion gap: 14 (ref 5–15)
BUN: 20 mg/dL (ref 8–23)
CO2: 22 mmol/L (ref 22–32)
Calcium: 9.5 mg/dL (ref 8.9–10.3)
Chloride: 100 mmol/L (ref 98–111)
Creatinine, Ser: 1.01 mg/dL — ABNORMAL HIGH (ref 0.44–1.00)
GFR calc Af Amer: 60 mL/min — ABNORMAL LOW (ref >60)
GFR calc non Af Amer: 51 mL/min — ABNORMAL LOW (ref >60)
Glucose, Bld: 157 mg/dL — ABNORMAL HIGH (ref 70–99)
Potassium: 4.4 mmol/L (ref 3.5–5.1)
Sodium: 136 mmol/L (ref 135–145)
Total Bilirubin: 0.8 mg/dL (ref 0.3–1.2)
Total Protein: 7.3 g/dL (ref 6.5–8.1)

## 2018-10-07 LAB — URINALYSIS, COMPLETE (UACMP) WITH MICROSCOPIC
Bilirubin Urine: NEGATIVE
Glucose, UA: NEGATIVE mg/dL
Hgb urine dipstick: NEGATIVE
Ketones, ur: NEGATIVE mg/dL
Nitrite: NEGATIVE
Protein, ur: 30 mg/dL — AB
Specific Gravity, Urine: 1.023 (ref 1.005–1.030)
Squamous Epithelial / HPF: NONE SEEN (ref 0–5)
WBC, UA: >50 WBC/hpf — ABNORMAL HIGH (ref 0–5)
pH: 5 (ref 5.0–8.0)

## 2018-10-07 LAB — SARS CORONAVIRUS 2 BY RT PCR (HOSPITAL ORDER, PERFORMED IN ~~LOC~~ HOSPITAL LAB): SARS Coronavirus 2: NEGATIVE

## 2018-10-07 LAB — INFLUENZA PANEL BY PCR (TYPE A & B)
Influenza A By PCR: NEGATIVE
Influenza B By PCR: NEGATIVE

## 2018-10-07 MED ORDER — CEFTRIAXONE SODIUM 1 G IJ SOLR
1.0000 g | Freq: Once | INTRAMUSCULAR | Status: AC
  Administered 2018-10-07: 1 g via INTRAMUSCULAR
  Filled 2018-10-07: qty 10

## 2018-10-07 MED ORDER — CEFTRIAXONE SODIUM 1 G IJ SOLR: 1.0000 g | INTRAMUSCULAR | 0 refills | Status: DC

## 2018-10-07 MED ORDER — CEFTRIAXONE SODIUM 1 G IJ SOLR: 1.0000 g | INTRAMUSCULAR | 0 refills | Status: AC

## 2018-10-07 MED ORDER — LIDOCAINE HCL (PF) 1 % IJ SOLN
5.0000 mL | Freq: Once | INTRAMUSCULAR | Status: AC
  Administered 2018-10-07: 5 mL via INTRADERMAL
  Filled 2018-10-07: qty 5

## 2018-10-07 NOTE — ED Triage Notes (Signed)
Patient presents to ED via POV from home. Patient reports she was exposed with someone who tested positive for COVID-19. Patients only reported symptoms is generalized weakness.

## 2018-10-07 NOTE — ED Notes (Signed)
Per Corky Downs MD patient is appropriate for flex.

## 2018-10-07 NOTE — ED Provider Notes (Signed)
[facility] Emergency Department Provider Note    First MD Initiated Contact with Patient 10/07/18 1349     (approximate)  I have reviewed the triage vital signs and the nursing notes.   HISTORY  Chief Complaint Weakness  Level V Caveat:  AMS dementia  HPI Kelly Mcgrath is a 85 y.o. female with the below past medical history presents the ER for evaluation of generalized malaise weakness as well as chills for the past few days.  She is accompanied by her husband who is her primary caretaker.  The both arrive from assisted living at [facility] reportedly feeling rundown and flulike illness for the past several days and her concern for COVID or influenza.  She is also having some urinary tract symptoms including increased dysuria.  No flank pain.  No nausea or vomiting.     Past Medical History:  Diagnosis Date  . CHF (congestive heart failure) (HCC)   . Coronary artery disease   . Neuropathy   . Vestibular active Meniere's disease    Family History  Problem Relation Age of Onset  . Hypertension Father    Past Surgical History:  Procedure Laterality Date  . Cardiac catheterization with stent placement    . INTRAMEDULLARY (IM) NAIL INTERTROCHANTERIC Left 01/16/2018   Procedure: INTRAMEDULLARY (IM) NAIL INTERTROCHANTRIC, ORIF FEMUR-LEFT;  Surgeon: Donato Heinz, MD;  Location: [facility] ORS;  Service: Orthopedics;  Laterality: Left;  . LAPAROSCOPIC GASTRIC BANDING     Subsequent reversal  . Lumbar surgery x4     Patient Active Problem List   Diagnosis Date Noted  . Hip fracture (HCC) 01/15/2018      Prior to Admission medications   Medication Sig Start Date End Date Taking? Authorizing Provider  atenolol (TENORMIN) 25 MG tablet Take 25 mg by mouth daily.    [provider]  cefTRIAXone (ROCEPHIN) 1 g injection Inject 1 g into the muscle daily for 2 doses. 10/07/18 10/09/18  Willy Eddy, MD  enoxaparin (LOVENOX) 40 MG/0.4ML injection  Inject 0.4 mLs (40 mg total) into the skin daily. 01/18/18   Dedra Skeens, PA-C  escitalopram (LEXAPRO) 20 MG tablet Take 20 mg by mouth daily. 12/15/17   [provider]  ferrous sulfate 325 (65 FE) MG tablet Take 1 tablet (325 mg total) by mouth 2 (two) times daily with a meal for 30 days. 01/19/18 02/18/18  Ihor Austin, MD  gabapentin (NEURONTIN) 600 MG tablet Take 600-900 mg by mouth 3 (three) times daily. Patient takes  at bedtime 12/30/17   [provider]  hydrochlorothiazide (HYDRODIURIL) 25 MG tablet Take 25 mg by mouth daily. 12/15/17   [provider]  losartan (COZAAR) 50 MG tablet Take 50 mg by mouth daily. 12/15/17   [provider]  meclizine (ANTIVERT) 25 MG tablet Take 25 mg by mouth 2 (two) times daily. 01/01/18   [provider]  nortriptyline (PAMELOR) 10 MG capsule Take 40 mg by mouth at bedtime. 12/30/17   [provider]  omeprazole (PRILOSEC) 20 MG capsule Take 20 mg by mouth 2 (two) times daily. 12/15/17   [provider]  traZODone (DESYREL) 100 MG tablet Take 100 mg by mouth at bedtime. 12/15/17   [provider]  zolpidem (AMBIEN) 10 MG tablet Take 5-10 mg by mouth at bedtime as needed for sleep. 01/15/18   [provider]    Allergies Patient has no known allergies.    Social History Social History   Tobacco Use  .  Smoking status: Never Smoker  . Smokeless tobacco: Never Used  Substance Use Topics  . Alcohol use: Never    Frequency: Never  . Drug use: Never    Review of Systems Patient denies headaches, rhinorrhea, blurry vision, numbness, shortness of breath, chest pain, edema, cough, abdominal pain, nausea, vomiting, diarrhea, dysuria, fevers, rashes or hallucinations unless otherwise stated above in HPI. ____________________________________________   PHYSICAL EXAM:  VITAL SIGNS: Vitals:   10/07/18 1318  BP: (!) 147/89  Pulse: 100  Resp: 20  Temp: 98.3 F (36.8  C)  SpO2: 96%    Constitutional: Alert pleasant and in NAD Eyes: Conjunctivae are normal.  Head: Atraumatic. Nose: No congestion/rhinnorhea. Mouth/Throat: Mucous membranes are moist.   Neck: No stridor. Painless ROM.  Cardiovascular: Normal rate, regular rhythm. Grossly normal heart sounds.  Good peripheral circulation. Respiratory: Normal respiratory effort.  No retractions. Lungs CTAB. Gastrointestinal: Soft and nontender. No distention. No abdominal bruits. No CVA tenderness. Genitourinary:  Musculoskeletal: No lower extremity tenderness nor edema.  No joint effusions. Neurologic:  Normal speech and language. No gross focal neurologic deficits are appreciated. No facial droop Skin:  Skin is warm, dry and intact. No rash noted. Psychiatric: Mood and affect are normal. Speech and behavior are normal.  ____________________________________________   LABS (all labs ordered are listed, but only abnormal results are displayed)  Results for orders placed or performed during the hospital encounter of 10/07/18 (from the past 24 hour(s))  CBC with Differential/Platelet     Status: Abnormal   Collection Time: 10/07/18  2:20 PM  Result Value Ref Range   WBC 10.6 (H) 4.0 - 10.5 K/uL   RBC 4.29 3.87 - 5.11 MIL/uL   Hemoglobin 13.3 12.0 - 15.0 g/dL   HCT 40.2 36.0 - 46.0 %   MCV 93.7 80.0 - 100.0 fL   MCH 31.0 26.0 - 34.0 pg   MCHC 33.1 30.0 - 36.0 g/dL   RDW 13.1 11.5 - 15.5 %   Platelets 245 150 - 400 K/uL   nRBC 0.0 0.0 - 0.2 %   Neutrophils Relative % 58 %   Neutro Abs 6.3 1.7 - 7.7 K/uL   Lymphocytes Relative 32 %   Lymphs Abs 3.4 0.7 - 4.0 K/uL   Monocytes Relative 6 %   Monocytes Absolute 0.6 0.1 - 1.0 K/uL   Eosinophils Relative 2 %   Eosinophils Absolute 0.2 0.0 - 0.5 K/uL   Basophils Relative 1 %   Basophils Absolute 0.1 0.0 - 0.1 K/uL   Immature Granulocytes 1 %   Abs Immature Granulocytes 0.05 0.00 - 0.07 K/uL  Comprehensive metabolic panel     Status: Abnormal    Collection Time: 10/07/18  2:20 PM  Result Value Ref Range   Sodium 136 135 - 145 mmol/L   Potassium 4.4 3.5 - 5.1 mmol/L   Chloride 100 98 - 111 mmol/L   CO2 22 22 - 32 mmol/L   Glucose, Bld 157 (H) 70 - 99 mg/dL   BUN 20 8 - 23 mg/dL   Creatinine, Ser 1.01 (H) 0.44 - 1.00 mg/dL   Calcium 9.5 8.9 - 10.3 mg/dL   Total Protein 7.3 6.5 - 8.1 g/dL   Albumin 4.3 3.5 - 5.0 g/dL   AST 33 15 - 41 U/L   ALT 27 0 - 44 U/L   Alkaline Phosphatase 65 38 - 126 U/L   Total Bilirubin 0.8 0.3 - 1.2 mg/dL   GFR calc non Af Amer 51 (L) >60 mL/min  GFR calc Af Amer 60 (L) >60 mL/min   Anion gap 14 5 - 15  SARS Coronavirus 2 [facility] order, Performed in [facility] hospital lab) Nasopharyngeal Nasopharyngeal Swab     Status: None   Collection Time: 10/07/18  2:20 PM   Specimen: Nasopharyngeal Swab  Result Value Ref Range   SARS Coronavirus 2 NEGATIVE NEGATIVE  Influenza panel by PCR (type A & B)     Status: None   Collection Time: 10/07/18  2:20 PM  Result Value Ref Range   Influenza A By PCR NEGATIVE NEGATIVE   Influenza B By PCR NEGATIVE NEGATIVE  Urinalysis, Complete w Microscopic     Status: Abnormal   Collection Time: 10/07/18  3:47 PM  Result Value Ref Range   Color, Urine YELLOW (A) YELLOW   APPearance CLOUDY (A) CLEAR   Specific Gravity, Urine 1.023 1.005 - 1.030   pH 5.0 5.0 - 8.0   Glucose, UA NEGATIVE NEGATIVE mg/dL   Hgb urine dipstick NEGATIVE NEGATIVE   Bilirubin Urine NEGATIVE NEGATIVE   Ketones, ur NEGATIVE NEGATIVE mg/dL   Protein, ur 30 (A) NEGATIVE mg/dL   Nitrite NEGATIVE NEGATIVE   Leukocytes,Ua LARGE (A) NEGATIVE   RBC / HPF 0-5 0 - 5 RBC/hpf   WBC, UA >50 (H) 0 - 5 WBC/hpf   Bacteria, UA MANY (A) NONE SEEN   Squamous Epithelial / LPF NONE SEEN 0 - 5   WBC Clumps PRESENT    ____________________________________________  EKG My review and personal interpretation at Time: 14:45   Indication: weakness  Rate: 99  Rhythm: sinus Axis: normal Other: normal intervals,  no stemi ____________________________________________  RADIOLOGY  I personally reviewed all radiographic images ordered to evaluate for the above acute complaints and reviewed radiology reports and findings.  These findings were personally discussed with the patient.  Please see medical record for radiology report.  ____________________________________________   PROCEDURES  Procedure(s) performed:  Procedures    Critical Care performed: no ____________________________________________   INITIAL IMPRESSION / ASSESSMENT AND PLAN / ED COURSE  Pertinent labs & imaging results that were available during my care of the patient were reviewed by me and considered in my medical decision making (see chart for details).   DDX: Pneumonia, COVID-19, flulike illness, dehydration, electrolyte abnormality, cystitis  Cathrine Pietrzyk is a 85 y.o. who presents to the ED with symptoms as described above.  Patient afebrile hemodynamically stable and nontoxic.  Multiple symptoms currently.  Have a high suspicion for cystitis.  Given their concern and exposures will test for flu and COVID.  Abdominal exam is soft and benign.  Clinical Course as of Oct 07 1650  Tue Oct 07, 2018  1651 Discussed results with patient and her husband.  States that previously her urine cultures have been sensitive to Rocephin and as he is physician has been administering IM medication at home with improvement.  Have added on urine culture but the patient is nonseptic otherwise well-appearing and they agree with plan to receive dose of antibiotics and discharge home with antibiotics.  Have discussed with the patient and available family all diagnostics and treatments performed thus far and all questions were answered to the best of my ability. The patient demonstrates understanding and agreement with plan.    [PR]    Clinical Course User Index [PR] Willy Eddy, MD    The patient was evaluated in Emergency Department  today for the symptoms described in the history of present illness. He/she was evaluated in the context  of the global COVID-19 pandemic, which necessitated consideration that the patient might be at risk for infection with the SARS-CoV-2 virus that causes COVID-19. Institutional protocols and algorithms that pertain to the evaluation of patients at risk for COVID-19 are in a state of rapid change based on information released by regulatory bodies including the CDC and federal and state organizations. These policies and algorithms were followed during the patient's care in the ED.  As part of my medical decision making, I reviewed the following data within the electronic MEDICAL RECORD NUMBER Nursing notes reviewed and incorporated, Labs reviewed, notes from prior ED visits and [place] Controlled Substance Database   ____________________________________________   FINAL CLINICAL IMPRESSION(S) / ED DIAGNOSES  Final diagnoses:  Generalized weakness  Cystitis      NEW MEDICATIONS STARTED DURING THIS VISIT:  Current Discharge Medication List    START taking these medications   Details  cefTRIAXone (ROCEPHIN) 1 g injection Inject 1 g into the muscle daily for 2 doses. Qty: 1 each, Refills: 0         Note:  This document was prepared using Dragon voice recognition software and may include unintentional dictation errors.    Willy Eddyobinson, Katrisha Segall, MD 10/07/18 [phone]

## 2018-10-07 NOTE — ED Notes (Signed)
See triage note  Presents with vague sx's  States she has been having a sore throat and weakness with slight cough  Denies any fever  States she is not sure when her sxs' started and when she was exposed  States she lives at [facility]

## 2018-10-09 LAB — URINE CULTURE: Culture: >=100000 — AB

## 2018-10-10 NOTE — Consult Note (Signed)
Called patient and spoke with her Husband (primary caretaker), they already received the report from the emergency room and they contacted PCP. He said they called in ciprofloxacin, so they are covered. No prescription was called in by me. Discussed case with Dr. Cherylann Banas.   Thanks,   Eleonore Chiquito, PharmD, BCPS

## 2019-07-02 DEATH — deceased

## 2020-02-10 IMAGING — CT CT HEAD W/O CM
3 series · 15 of 47 positions shown, 18 images · non-contrast
Comparison: None.

CLINICAL DATA: Left-sided headaches for 2 months

EXAM:
CT HEAD WITHOUT CONTRAST
TECHNIQUE: Contiguous axial images were obtained from the base of the skull
through the vertex without intravenous contrast.

[Series 3: head wo · axial · 0.41mm/px · z∈[-135,+0]mm · 9 of 33 slices shown, 12 images]
[im 3/33  brain]
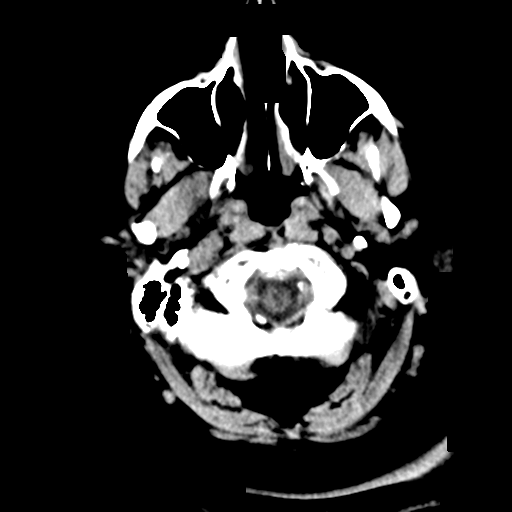
[im 3/33  bone]
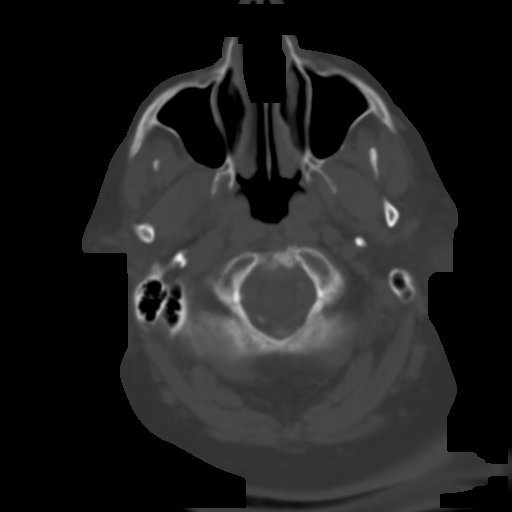
[im 6/33  brain]
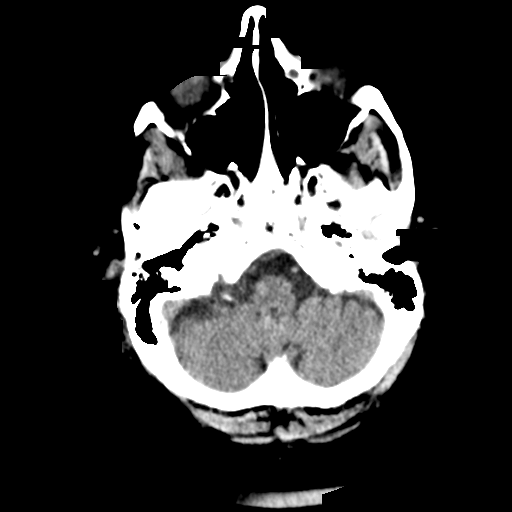
[im 9/33  brain]
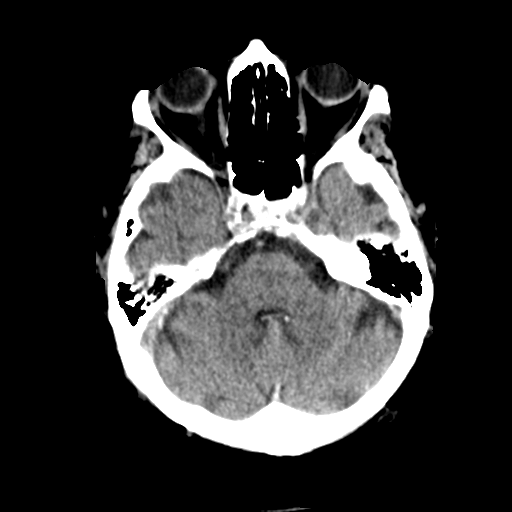
[im 13/33  brain]
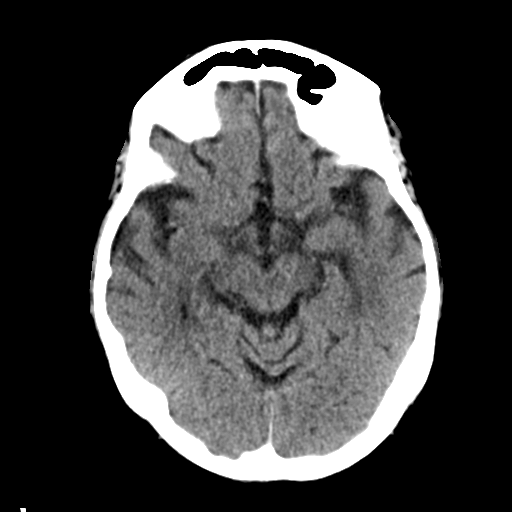
[im 17/33  brain]
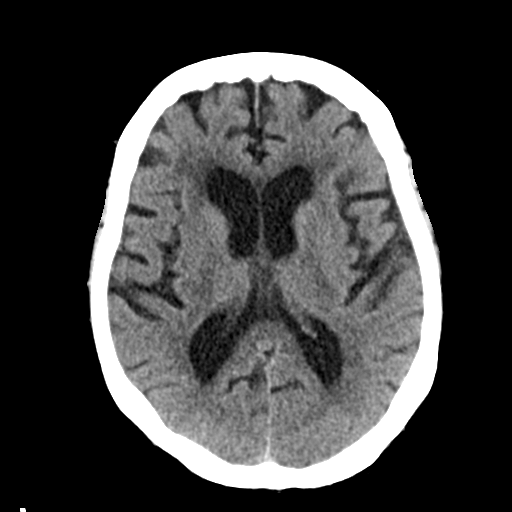
[im 17/33  bone]
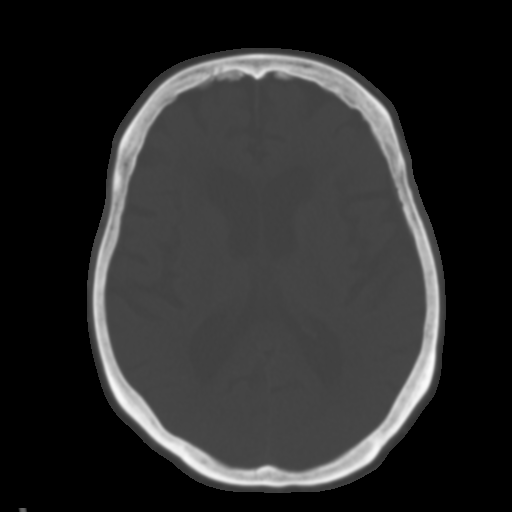
[im 20/33  brain]
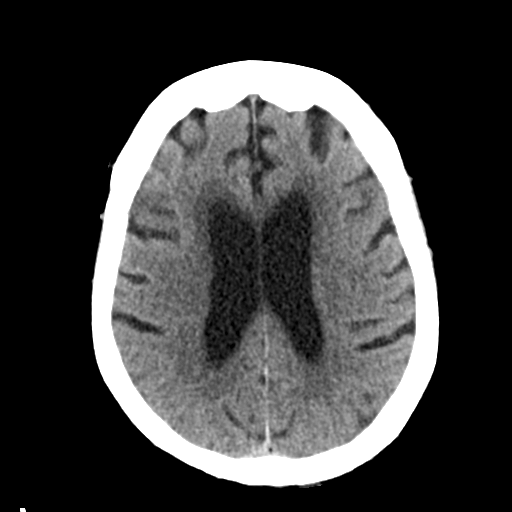
[im 24/33  brain]
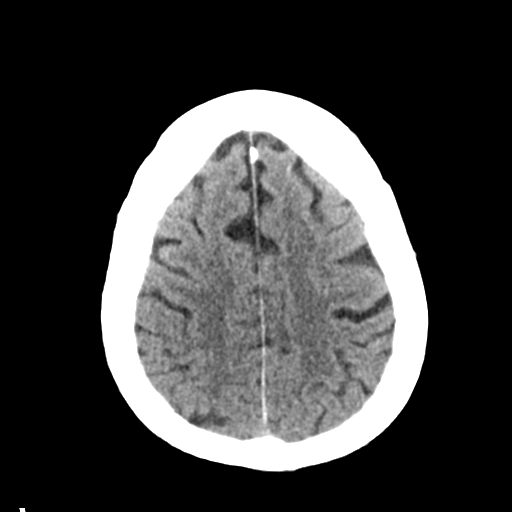
[im 27/33  brain]
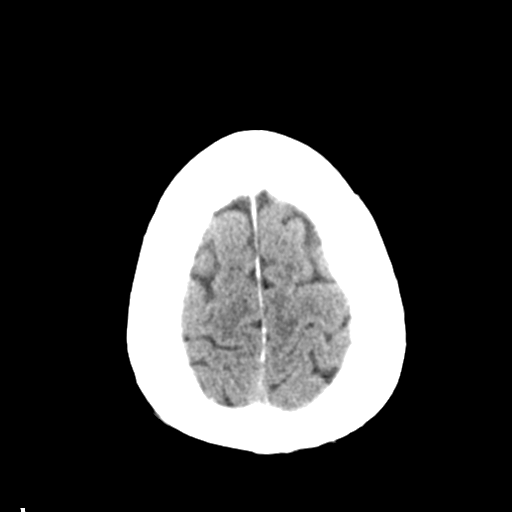
[im 30/33  brain]
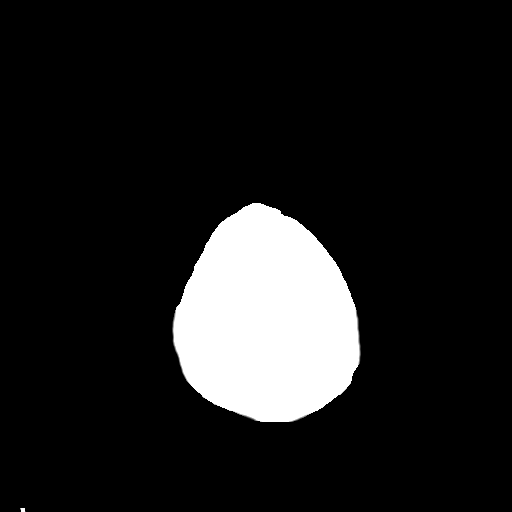
[im 30/33  bone]
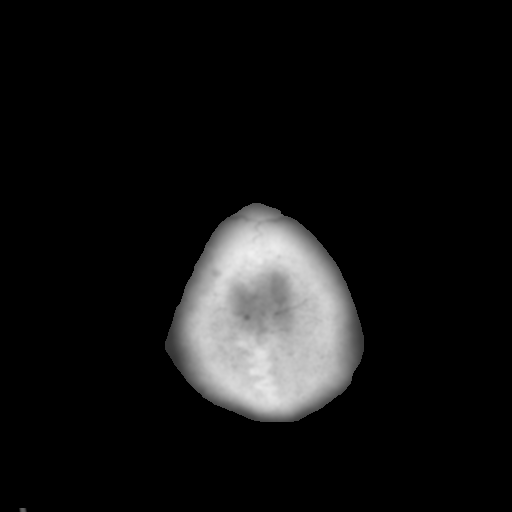

[Series 4: coronal soft tissue · coronal · 0.34mm/px · 3 of 71 slices shown]
[im 24/71  brain]
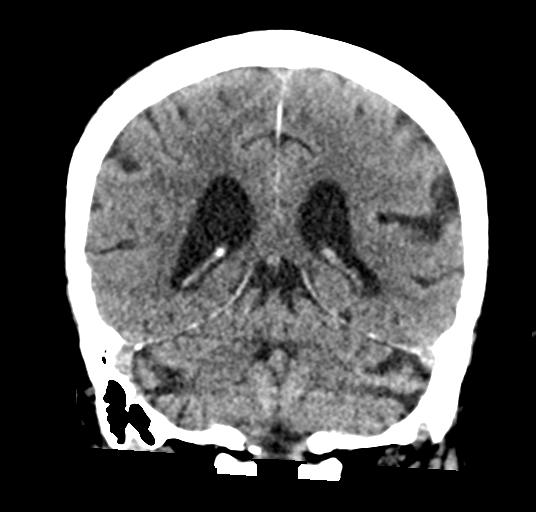
[im 32/71  brain]
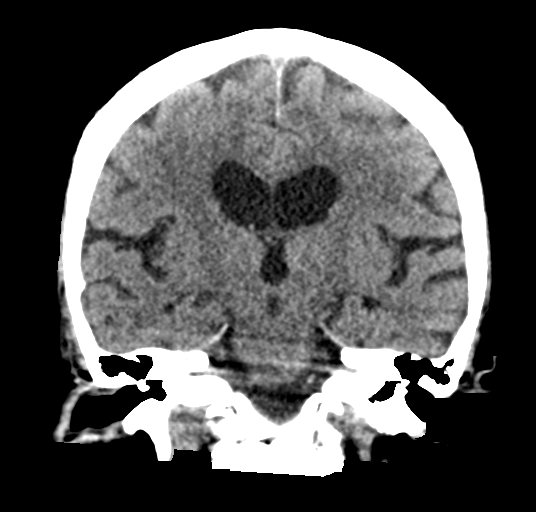
[im 39/71  brain]
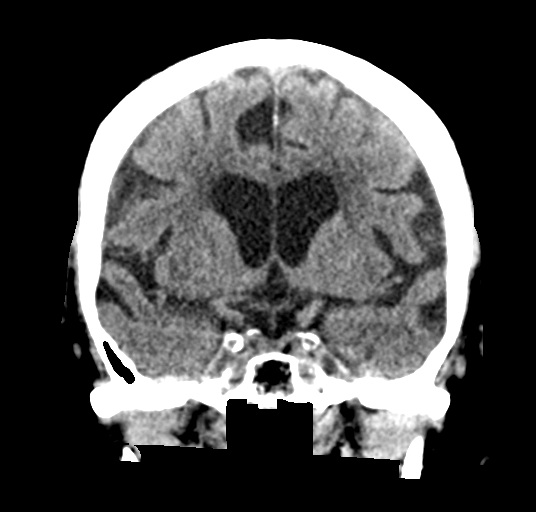

[Series 5: sagittal soft tissue · sagittal · 0.34mm/px · 3 of 54 slices shown]
[im 18/54  brain]
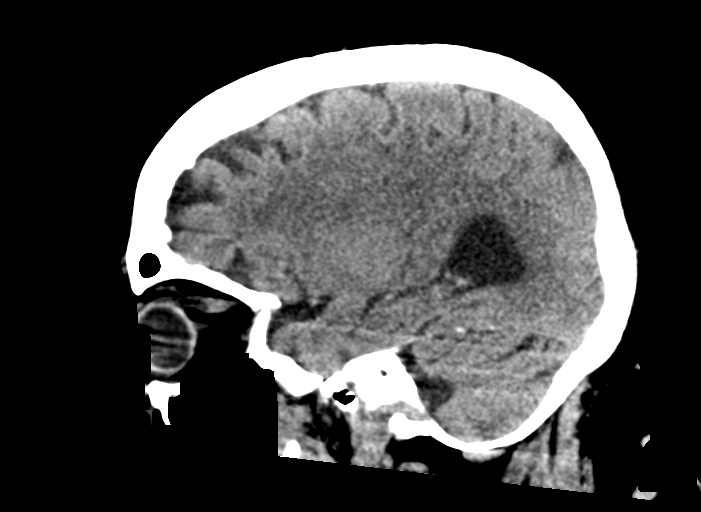
[im 27/54  brain]
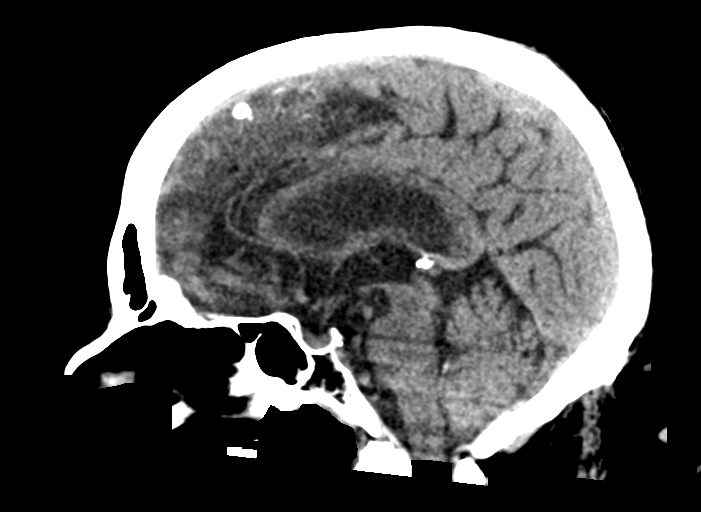
[im 36/54  brain]
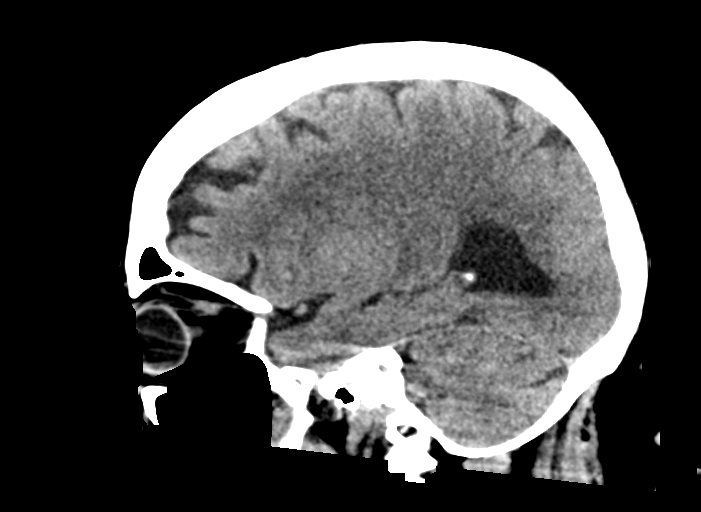

[15 of 47 positions shown; findings below may reference images not displayed]

FINDINGS: Brain: No evidence of acute infarction, hemorrhage, hydrocephalus,
extra-axial collection or mass lesion/mass effect. Scattered
low-density changes within the periventricular and subcortical white
matter compatible with chronic microvascular ischemic change.
Mild-moderate diffuse cerebral volume loss.

Vascular: Mild atherosclerotic calcifications involving the large
vessels of the skull base. No unexpected hyperdense vessel.

Skull: Normal. Negative for fracture or focal lesion.

Sinuses/Orbits: No acute finding.

Other: None.
IMPRESSION: 1. No acute intracranial findings.

2. Chronic microvascular ischemic change and cerebral volume loss.
